# Patient Record
Sex: Male | Born: 1940 | Race: White | Hispanic: No | Marital: Married | Smoking: Former smoker
Health system: Southern US, Community
[De-identification: ages and names within clinical notes are randomized; demographics above are authoritative.]

## PROBLEM LIST (undated history)

## (undated) DIAGNOSIS — N400 Enlarged prostate without lower urinary tract symptoms: Secondary | ICD-10-CM

## (undated) DIAGNOSIS — I712 Thoracic aortic aneurysm, without rupture: Secondary | ICD-10-CM

## (undated) DIAGNOSIS — I2510 Atherosclerotic heart disease of native coronary artery without angina pectoris: Secondary | ICD-10-CM

## (undated) DIAGNOSIS — I493 Ventricular premature depolarization: Secondary | ICD-10-CM

## (undated) DIAGNOSIS — E785 Hyperlipidemia, unspecified: Secondary | ICD-10-CM

## (undated) DIAGNOSIS — I5032 Chronic diastolic (congestive) heart failure: Secondary | ICD-10-CM

## (undated) DIAGNOSIS — I7121 Aneurysm of the ascending aorta, without rupture: Secondary | ICD-10-CM

## (undated) HISTORY — DX: Chronic diastolic (congestive) heart failure: I50.32

## (undated) HISTORY — DX: Atherosclerotic heart disease of native coronary artery without angina pectoris: I25.10

## (undated) HISTORY — DX: Benign prostatic hyperplasia without lower urinary tract symptoms: N40.0

## (undated) HISTORY — DX: Hyperlipidemia, unspecified: E78.5

## (undated) HISTORY — DX: Aneurysm of the ascending aorta, without rupture: I71.21

## (undated) HISTORY — DX: Ventricular premature depolarization: I49.3

## (undated) HISTORY — DX: Thoracic aortic aneurysm, without rupture: I71.2

---

## 2002-05-30 ENCOUNTER — Encounter: Payer: Self-pay | Admitting: Urology

## 2002-05-30 ENCOUNTER — Encounter: Admission: RE | Admit: 2002-05-30 | Discharge: 2002-05-30 | Payer: Self-pay | Admitting: Urology

## 2010-01-30 ENCOUNTER — Telehealth (INDEPENDENT_AMBULATORY_CARE_PROVIDER_SITE_OTHER): Payer: Self-pay | Admitting: *Deleted

## 2010-01-30 ENCOUNTER — Ambulatory Visit: Payer: Self-pay | Admitting: Cardiovascular Disease

## 2010-02-07 ENCOUNTER — Telehealth (INDEPENDENT_AMBULATORY_CARE_PROVIDER_SITE_OTHER): Payer: Self-pay | Admitting: *Deleted

## 2010-02-11 ENCOUNTER — Ambulatory Visit: Payer: Self-pay | Admitting: Cardiology

## 2010-02-11 ENCOUNTER — Encounter: Payer: Self-pay | Admitting: Cardiology

## 2010-02-11 ENCOUNTER — Encounter (HOSPITAL_COMMUNITY): Admission: RE | Admit: 2010-02-11 | Discharge: 2010-03-07 | Payer: Self-pay | Admitting: Cardiovascular Disease

## 2010-02-11 ENCOUNTER — Ambulatory Visit: Payer: Self-pay

## 2010-02-11 ENCOUNTER — Ambulatory Visit (HOSPITAL_COMMUNITY): Admission: RE | Admit: 2010-02-11 | Discharge: 2010-02-11 | Payer: Self-pay | Admitting: Cardiovascular Disease

## 2010-02-14 HISTORY — PX: CORONARY ANGIOPLASTY: SHX604

## 2010-02-14 HISTORY — PX: CARDIAC CATHETERIZATION: SHX172

## 2010-02-20 ENCOUNTER — Telehealth: Payer: Self-pay | Admitting: Cardiovascular Disease

## 2010-02-22 ENCOUNTER — Telehealth (INDEPENDENT_AMBULATORY_CARE_PROVIDER_SITE_OTHER): Payer: Self-pay | Admitting: *Deleted

## 2010-02-22 ENCOUNTER — Ambulatory Visit: Payer: Self-pay | Admitting: Cardiovascular Disease

## 2010-02-27 ENCOUNTER — Inpatient Hospital Stay (HOSPITAL_COMMUNITY): Admission: RE | Admit: 2010-02-27 | Discharge: 2010-02-28 | Payer: Self-pay | Admitting: Cardiovascular Disease

## 2010-02-27 ENCOUNTER — Ambulatory Visit: Payer: Self-pay | Admitting: Cardiovascular Disease

## 2010-03-07 ENCOUNTER — Ambulatory Visit: Payer: Self-pay | Admitting: Cardiovascular Disease

## 2010-03-18 ENCOUNTER — Telehealth: Payer: Self-pay | Admitting: Cardiovascular Disease

## 2010-03-21 ENCOUNTER — Telehealth: Payer: Self-pay | Admitting: Cardiovascular Disease

## 2010-05-14 ENCOUNTER — Telehealth: Payer: Self-pay | Admitting: Cardiovascular Disease

## 2010-05-20 ENCOUNTER — Telehealth: Payer: Self-pay | Admitting: Cardiovascular Disease

## 2010-06-04 ENCOUNTER — Ambulatory Visit: Payer: Self-pay | Admitting: Cardiovascular Disease

## 2010-07-16 NOTE — Progress Notes (Signed)
  Phone Note Outgoing Call   Call placed by: Dessie Coma  LPN,  March 21, 2010 3:47 PM Call placed to: Patient Summary of Call: No Answer:  Tried to call patient and no answering machine.  Samples of Plavix available.  Follow-up for Phone Call        Phone call completed: Spoke with patient's wife-notified her of Plavix samples we have here for patient and per Dr. Kirke Corin, it is dangerous to stop the Plavix before 1 year.  Plavix will become generic in 04/12.  They are waiting to send records of income in to Plavix indigent program to see if eligible.  Follow-up by: Dessie Coma  LPN,  March 22, 2010 11:54 AM

## 2010-07-16 NOTE — Progress Notes (Signed)
Summary: Nuc Pre-Procedure  Phone Note Outgoing Call Call back at [facility] Phone [phone]   Call placed by: Antionette Char RN,  February 07, 2010 1:40 PM Call placed to: Patient Reason for Call: Confirm/change Appt Summary of Call: Reviewed information on [employer] Information Sheet (see scanned document for further details).  Spoke with patient's wife.      Nuclear Med Background Indications for Stress Test: Evaluation for Ischemia, Abnormal EKG  Indications Comments: Inferior Q waves    Symptoms: Chest Tightness, DOE, Fatigue, Palpitations    Nuclear Pre-Procedure Cardiac Risk Factors: Lipids, Smoker

## 2010-07-16 NOTE — Progress Notes (Signed)
Summary: liver/lipid results  Phone Note Outgoing Call   Call placed by: Dessie Coma  LPN,  May 14, 2010 9:09 AM Call placed to: Patient Summary of Call: LM with wife-lab test showed liver test is fine.  Cholesterol is better but still not at target.  Per Dr. Kirke Corin, to stop Simvastatin and start Atorvastatin 80mg  at bedtime .  To receck liver/lipid profile in 6 weeks.

## 2010-07-16 NOTE — Progress Notes (Signed)
----   Converted from flag ---- ---- 02/22/2010 3:13 PM, [name], CMA, AAMA wrote: PT HAS MEDICARE AND [employer] MCR SUP, NO PAC RQD  ---- 02/22/2010 3:10 PM, Dessie Coma  LPN wrote: Martin [name]. Hrt Cath scheduled with Dr. Kirke Corin for Wed. 02/27/10 for Dx abnormal stress test.  Thanks, Victorino Dike ------------------------------

## 2010-07-16 NOTE — Progress Notes (Signed)
----   Converted from flag ---- ---- 01/30/2010 3:26 PM, [name], CMA, AAMA wrote: PT HAS MCR AND [employer] MCR SUP, NO PAC RQD  ---- 01/30/2010 3:06 PM, Dessie Coma  LPN wrote: Eugenie Birks and Echo scheduled at [facility] for Dx: 786.50/786.90.  Thanks, Victorino Dike ------------------------------

## 2010-07-16 NOTE — Progress Notes (Signed)
Summary: CAN'T AFFORD MEDS  Phone Note Call from Patient   Summary of Call: PT CALLED AND STATED HE COULD NOT AFFORD PLAVIX.  $208 FOR 30 PILLS.  IS THERE ANYTHING ELSE HE CAN TAKE?  IF NOT, DOES HE QUALIFY FOR THE FREE DRUG PLAN WITH PLAVIX.  [phone] Initial call taken by: Park Breed,  March 18, 2010 9:47 AM    will give samples and application

## 2010-07-16 NOTE — Progress Notes (Signed)
  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 Kirke Corin, stress test abnormal and needs f/u ASAP.  Appt. scheduled for Fri. 02/22/10 at 2:30pm.  If patient not back in on truck, will reschedule.

## 2010-07-16 NOTE — Progress Notes (Signed)
Summary: resume Simvastatin  Phone Note Call from Patient   Caller: Spouse Summary of Call: T.C. from spouse-generic Lipitor is $150 without insurance.  Can not afford.  Per Dr. Kirke Corin, may resume Simvastatin 40mg  until generic Lipitor becomes cheaper. Initial call taken by: Dessie Coma  LPN,  May 20, 2010 10:44 AM

## 2010-07-16 NOTE — Assessment & Plan Note (Signed)
Summary: Cardiology Nuclear Testing  Nuclear Med Background Indications for Stress Test: Evaluation for Ischemia, Abnormal EKG  Indications Comments: .   History Comments: NO DOCUMENTED CAD  Symptoms: Chest Tightness, DOE, Fatigue, Rapid HR    Nuclear Pre-Procedure Cardiac Risk Factors: Lipids, Smoker Caffeine/Decaff Intake: None NPO After: 5:00 PM Lungs: Diminished bilaterally, but clear. IV 0.9% NS with Angio Cath: 20g     IV Site: R Antecubital IV Started by: Irean Hong, RN Chest Size (in) 42     Height (in): 65 Weight (lb): 181 BMI: 30.23  Nuclear Med Study 1 or 2 day study:  1 day     Stress Test Type:  Treadmill/Lexiscan Reading MD:  Willa Rough, MD     Referring MD:  Lorine Bears, MD Resting Radionuclide:  Technetium 56m Tetrofosmin     Resting Radionuclide Dose:  11 mCi  Stress Radionuclide:  Technetium 65m Tetrofosmin     Stress Radionuclide Dose:  33 mCi   Stress Protocol      Max HR:  93 bpm     Predicted Max HR:  150 bpm  Max Systolic BP: 163 mm Hg     Percent Max HR:  62 %Rate Pressure Product:  11914  Lexiscan: 0.4 mg   Stress Test Technologist:  [employer]a [employer], CMA-N     Nuclear Technologist:  Domenic Polite, CNMT  Rest Procedure  Myocardial perfusion imaging was performed at rest 45 minutes following the intravenous administration of Technetium 4m Tetrofosmin.  Stress Procedure  The patient received IV Lexiscan 0.4 mg over 15-seconds with concurrent low level exercise and then technetium 15m Tetrofosmin was injected at 30-seconds while the patient continued walking one more minute.  There were no significant changes with infusion.  Quantitative spect images were obtained after a 45 minute delay.  QPS Raw Data Images:  Patient motion noted; appropriate software correction applied. Stress Images:  There is significant decrease in activity localized to the inferior base only Rest Images:  Same as stress Subtraction (SDS):  No evidence of  ischemia. Transient Ischemic Dilatation:  0.98  (Normal <1.22)  Lung/Heart Ratio:  0.33  (Normal <0.45)  Quantitative Gated Spect Images QGS EDV:  99 ml QGS ESV:  49 ml QGS EF:  50 % QGS cine images:  Hypokinesis at the inferior base.  Findings Abnormal      Overall Impression  Exercise Capacity: Lexiscan with no exercise. BP Response: Normal blood pressure response. Clinical Symptoms: SOB ECG Impression: No significant ST segment change suggestive of ischemia. Overall Impression Comments: The data suggests scar localized to the base of the inferior wall. There is no ischemia.  Appended Document: Cardiology Nuclear Testing reviewed results with pt.  He will follow up at scheduled appt.

## 2010-08-29 LAB — BASIC METABOLIC PANEL
BUN: 7 mg/dL (ref 6–23)
BUN: 10 mg/dL (ref 6–23)
CO2: 25 mEq/L (ref 19–32)
CO2: 28 mEq/L (ref 19–32)
Calcium: 8.8 mg/dL (ref 8.4–10.5)
Calcium: 8.6 mg/dL (ref 8.4–10.5)
Chloride: 100 mEq/L (ref 96–112)
Chloride: 103 mEq/L (ref 96–112)
Creatinine, Ser: 0.91 mg/dL (ref 0.4–1.5)
Creatinine, Ser: 0.99 mg/dL (ref 0.4–1.5)
GFR calc Af Amer: >60 mL/min (ref >60)
GFR calc Af Amer: >60 mL/min (ref >60)
GFR calc non Af Amer: >60 mL/min (ref >60)
GFR calc non Af Amer: >60 mL/min (ref >60)
Glucose, Bld: 107 mg/dL — ABNORMAL HIGH (ref 70–99)
Glucose, Bld: 111 mg/dL — ABNORMAL HIGH (ref 70–99)
Potassium: 4.4 mEq/L (ref 3.5–5.1)
Potassium: 4.5 mEq/L (ref 3.5–5.1)
Sodium: 134 mEq/L — ABNORMAL LOW (ref 135–145)
Sodium: 134 mEq/L — ABNORMAL LOW (ref 135–145)

## 2010-08-29 LAB — CBC
HCT: 43.9 % (ref 39.0–52.0)
Hemoglobin: 15.2 g/dL (ref 13.0–17.0)
MCH: 30.4 pg (ref 26.0–34.0)
MCHC: 34.6 g/dL (ref 30.0–36.0)
MCHC: 34.8 g/dL (ref 30.0–36.0)
MCV: 87.8 fL (ref 78.0–100.0)
Platelets: 211 10*3/uL (ref 150–400)
Platelets: 196 10*3/uL (ref 150–400)
RBC: 5 MIL/uL (ref 4.22–5.81)
RDW: 13 % (ref 11.5–15.5)
RDW: 12.7 % (ref 11.5–15.5)
WBC: 7.5 10*3/uL (ref 4.0–10.5)
WBC: 6.2 10*3/uL (ref 4.0–10.5)

## 2010-08-29 LAB — CARDIAC PANEL(CRET KIN+CKTOT+MB+TROPI)
CK, MB: 1.1 ng/mL (ref 0.3–4.0)
Relative Index: INVALID (ref 0.0–2.5)
Total CK: 58 U/L (ref 7–232)
Total CK: 59 U/L (ref 7–232)

## 2010-08-29 LAB — APTT: aPTT: 27 seconds (ref 24–37)

## 2010-08-29 LAB — PROTIME-INR: INR: 0.98 (ref 0.00–1.49)

## 2010-09-03 ENCOUNTER — Telehealth: Payer: Self-pay | Admitting: Cardiovascular Disease

## 2010-09-03 NOTE — Telephone Encounter (Signed)
Returned patient's call-spoke to wife Steward Drone.  Patient has a tooth that needs pulling and is on Plavix and Baby Aspirin daily.  Wants to know how long he needs to hold medications prior to dental appt.  To call patient back on cell#[phone].

## 2010-09-04 ENCOUNTER — Other Ambulatory Visit: Payer: Self-pay | Admitting: Cardiovascular Disease

## 2010-09-06 ENCOUNTER — Telehealth: Payer: Self-pay | Admitting: Cardiovascular Disease

## 2010-09-06 NOTE — Telephone Encounter (Signed)
Pt's wife calling looking for RX to send to [place] for plavix--family cannot afford --advised i have located 4 boxes of plavix which i will give to jennifer toothman to carry back to [place] for pt--also advised wife, dr Kirke Corin needs to write RX for her to send to [place] and will be in [place] office on Thursday--wife agrees--nt

## 2010-09-06 NOTE — Telephone Encounter (Signed)
Pt wife calling upset regarding pt's meds. Pt paying $ 58.00 for 7 pills - dr. Kirke Corin told pt to order for [place]. Pt wife would like a called today. plavix 75 mg. Fax # [phone]. Customer # [phone].

## 2010-09-06 NOTE — Telephone Encounter (Signed)
Pharmacy called requesting clarification on Venlafaxine 150mg . Med is XR and can be dispensed on 1 1/2 tablets

## 2010-09-09 NOTE — Telephone Encounter (Signed)
T.C.from wife-anxious about getting Plavix from [place].  Informed wife I was working in [place] today but will be in [place] tomorrow-will pick up Plavix samples then to give her on Wed. When I return to [place] to work.  Also, Dr. Kirke Corin had signed an order for the medication and have tried to fax it several times.

## 2010-09-12 NOTE — Telephone Encounter (Signed)
Tried to call patient no answer. 

## 2010-09-26 ENCOUNTER — Other Ambulatory Visit: Payer: Self-pay | Admitting: *Deleted

## 2010-09-26 ENCOUNTER — Encounter: Payer: Self-pay | Admitting: Cardiovascular Disease

## 2010-09-26 MED ORDER — METOPROLOL TARTRATE 25 MG PO TABS: 25.0000 mg | ORAL_TABLET | Freq: Two times a day (BID) | ORAL | Status: DC

## 2010-10-03 ENCOUNTER — Ambulatory Visit (INDEPENDENT_AMBULATORY_CARE_PROVIDER_SITE_OTHER): Payer: Medicare Other | Admitting: Cardiovascular Disease

## 2010-10-03 ENCOUNTER — Encounter: Payer: Self-pay | Admitting: Cardiovascular Disease

## 2010-10-03 DIAGNOSIS — I77819 Aortic ectasia, unspecified site: Secondary | ICD-10-CM

## 2010-10-03 DIAGNOSIS — I2510 Atherosclerotic heart disease of native coronary artery without angina pectoris: Secondary | ICD-10-CM | POA: Insufficient documentation

## 2010-10-03 DIAGNOSIS — I77810 Thoracic aortic ectasia: Secondary | ICD-10-CM | POA: Insufficient documentation

## 2010-10-03 DIAGNOSIS — E785 Hyperlipidemia, unspecified: Secondary | ICD-10-CM | POA: Insufficient documentation

## 2010-10-03 NOTE — Patient Instructions (Signed)
Your physician recommends that you schedule a follow-up appointment in: 6 mths  

## 2010-10-03 NOTE — Progress Notes (Signed)
HPI  Martin [name] is here for a follow up visit. He is doing very well without any reported chest pain, dyspnea or palpitations. He was having difficult time affording Plavix. However, he is now getting it from [place] without problems.   No Known Allergies   Current Outpatient Prescriptions on File Prior to Visit  Medication Sig Dispense Refill  . aspirin 81 MG tablet Take 81 mg by mouth daily.        . clopidogrel (PLAVIX) 75 MG tablet Take 75 mg by mouth daily.        . metoprolol tartrate (LOPRESSOR) 25 MG tablet Take 1 tablet (25 mg total) by mouth 2 (two) times daily.  60 tablet  6  . simvastatin (ZOCOR) 40 MG tablet Take 40 mg by mouth at bedtime.        . Tamsulosin HCl (FLOMAX) 0.4 MG CAPS Take 0.4 mg by mouth daily.        Marland Kitchen atorvastatin (LIPITOR) 80 MG tablet Take 80 mg by mouth daily.    Not taking this       Past Medical History  Diagnosis Date  . BPH (benign prostatic hyperplasia)   . Dilated aortic root     Ascending aorta (noted during cath)  . CAD (coronary artery disease)   . HLD (hyperlipidemia)      Past Surgical History  Procedure Date  . Cardiac catheterization 02/2010    20% left main, 80% ostial/proximal LAD, 60% distal RCA leading into a small PDA which is occluded distally  . Coronary angioplasty 02/2010    LAD: 3.0 X 28 mm Promus DES     Family History  Problem Relation Age of Onset  . Cancer    . Heart disease    . Aneurysm      thoracic rupture     History   Social History  . Marital Status: Married    Spouse Name: N/A    Number of Children: 1  . Years of Education: N/A   Occupational History  . truck driver    Social History Main Topics  . Smoking status: Current Everyday Smoker -- 0.2 packs/day for 50 years  . Smokeless tobacco: Not on file  . Alcohol Use: Yes     beer on weekend  . Drug Use: No  . Sexually Active: Not on file   Other Topics Concern  . Not on file   Social History Narrative  . No narrative on file      ROS Constitutional: Negative for fever, chills, diaphoresis, activity change, appetite change and fatigue.  HENT: Negative for hearing loss, nosebleeds, congestion, sore throat, facial swelling, drooling, trouble swallowing, neck pain, voice change, sinus pressure and tinnitus.  Eyes: Negative for photophobia, pain, discharge and visual disturbance.  Respiratory: Negative for apnea, cough, chest tightness, shortness of breath and wheezing.  Cardiovascular: Negative for chest pain, palpitations and leg swelling.  Gastrointestinal: Negative for nausea, vomiting, abdominal pain, diarrhea, constipation, blood in stool and abdominal distention.  Genitourinary: Negative for dysuria, urgency, frequency, hematuria and decreased urine volume.  Musculoskeletal: Negative for myalgias, back pain, joint swelling, arthralgias and gait problem.  Skin: Negative for color change, pallor, rash and wound.  Neurological: Negative for dizziness, tremors, seizures, syncope, speech difficulty, weakness, light-headedness, numbness and headaches.  Psychiatric/Behavioral: Negative for suicidal ideas, hallucinations, behavioral problems and agitation. The patient is not nervous/anxious.     PHYSICAL EXAM   BP 139/79  Pulse 67  Ht 5\' 7"  (1.702 m)  Wt 176 lb (79.833 kg)  BMI 27.57 kg/m2  SpO2 94% Constitutional: He is oriented to person, place, and time. He appears well-developed and well-nourished. No distress.  HENT: No nasal discharge.  Head: Normocephalic and atraumatic.  Eyes: Pupils are equal, round, and reactive to light. Right eye exhibits no discharge. Left eye exhibits no discharge.  Neck: Normal range of motion. Neck supple. No JVD present. No thyromegaly present.  Cardiovascular: Normal rate, regular rhythm, normal heart sounds and intact distal pulses. Exam reveals no gallop and no friction rub.  No murmur heard.  Pulmonary/Chest: Effort normal and breath sounds normal. No stridor. No  respiratory distress. He has no wheezes. He has no rales. He exhibits no tenderness.  Abdominal: Soft. Bowel sounds are normal. He exhibits no distension. There is no tenderness. There is no rebound and no guarding.  Musculoskeletal: Normal range of motion. He exhibits no edema and no tenderness.  Neurological: He is alert and oriented to person, place, and time. Coordination normal.  Skin: Skin is warm and dry. No rash noted. He is not diaphoretic. No erythema. No pallor.  Psychiatric: He has a normal mood and affect. His behavior is normal. Judgment and thought content normal.        ASSESSMENT AND PLAN

## 2010-10-03 NOTE — Assessment & Plan Note (Signed)
He is doing very well without any symptoms suggestive of angina, heart failure or arrhythmia. Continue medical therapy. Continue dual antiplatelet therapy at least until 02/2011.

## 2010-10-03 NOTE — Assessment & Plan Note (Signed)
Asymptomatic. Noted during cardiac cath. Will plan on doing CTA later this year.

## 2010-10-03 NOTE — Assessment & Plan Note (Signed)
He was supposed to be taking Lipitor but it appears that he is still taking Simvastatin. Will check fasting lipid profile in 6 months. He has improved his diet. Goal LDL<70.

## 2010-10-29 NOTE — Assessment & Plan Note (Signed)
[facility]                        [place] [facility] OFFICE NOTE   NAME:[name], Martin NAUTA                    MRN:          [id number]  DATE:02/22/2010                            DOB:          [birth year]    PROBLEM LIST:  1. Tobacco use.  2. Hyperlipidemia.   INTERIM HISTORY:  I saw Martin [name] last month for evaluation of  significant exertional dyspnea with abnormal EKG suggestive of prior  inferior myocardial infarction.  He underwent workup with a nuclear  stress test as well as an echocardiogram.  Overall, he still feels the  same.  He gets out of breath with very minimal activities even walking  to the mailbox.  This has been progressive over the last few months.  He  does not have chest pain most of the time.  There is no orthopnea or  PND.  He is not aware of any prior history of myocardial infarction.   MEDICATIONS:  1. Vitamin A.  2. He was supposed to be on aspirin 81 mg once daily which will be      resumed today.  3. Simvastatin 40 mg at bedtime will be started today.  4. Metoprolol tartrate 25 mg twice daily will be started today.   ALLERGIES:  No known drug allergies.   PHYSICAL EXAMINATION:  GENERAL:  The patient is pleasant and appears to  be in no acute distress.  VITAL SIGNS:  Weight is 180.8 pounds, blood pressure is 132/92, pulse is  67, oxygen saturation is 94% on room air.  HEENT:  Normocephalic and atraumatic.  NECK:  No JVD or carotid bruits.  RESPIRATORY:  Normal respiratory effort with no use of accessory  muscles.  Auscultation reveals normal breath sounds.  CARDIOVASCULAR:  Normal PMI.  Normal S1 and S2 with no gallops or murmurs.  ABDOMEN:  Benign, nontender, nondistended.  EXTREMITIES:  With no clubbing, cyanosis or edema.  SKIN:  No rash.  PSYCHIATRIC:  He is alert, oriented x3.  He appears to be slightly  anxious.  MUSCULOSKELETAL:  There is normal muscle strength in the upper and lower  extremities.   IMPRESSION:  1. Exertional dyspnea with abnormal stress test.  The stress test      showed evidence of prior inferior infarct.  This indicates the      presence of underlying coronary artery disease.  Obviously he might      have significant disease in other territories as well.  He has      significant physical limitation with dyspnea with minimal      activities.  Thus I recommend proceeding with a cardiac      catheterization and possible coronary intervention.  The risks and      benefits were discussed with him.  He did have an echocardiogram      also which showed normal LV systolic function with inferior wall      hypokinesis, aortic sclerosis without stenosis and mild mitral      regurgitation.  He has history of prolonged smoking and possible      that he might have an element of  COPD as well but currently we need      to evaluate his cardiac status.  We will continue with aspirin 81      mg daily.  I will go ahead and add metoprolol tartrate 25 mg twice      daily.  2. Hyperlipidemia.  His LDL was 159.  We will go ahead and start      simvastatin 40 mg at bedtime.  3. Tobacco use.  He was counseled about importance of smoking      cessation.  4. Family history of thoracic aneurysm rupture that was in his      daughter.  Not sure of inheritance was from his wife's side.  The      patient did have a dilated ascending aorta on echocardiogram.  We      will evaluate that during cardiac catheterization or we might      obtain CT of the aorta if needed.     Lorine Bears, MD  Electronically Signed    MA/MedQ  DD: 02/22/2010  DT: 02/23/2010  Job #: [id number]

## 2010-10-29 NOTE — Letter (Signed)
January 30, 2010    Martin [name], M.D.  [address]  [place], [place]  [address]   RE:  Martin, [name]  MRN:  [id number]  /  DOB:  09-Nov-1940   Dear Dr. Sherral [name]:   Thank you for referring Martin [name] for cardiac evaluation.  As you are  aware, this is a 70 year old male who has the following problem list:  1. Tobacco use.  2. Hyperlipidemia.   The patient is here today for evaluation of dyspnea and chest  discomfort.  Overall, he was in his usual health up until a few months  ago when he started having dyspnea with minimal activities.  He gets out  of breath even walking less than 100 feet.  He occasionally gets chest  tightness and a pressure feeling with this.  He also feels palpitations.  There has been no presyncope or syncope.  There is no orthopnea or PND.  He denies any previous cardiac history.  There is no prior myocardial  infarction.  He does not have history of diabetes or hypertension.  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   PAST MEDICAL HISTORY:  As outlined above.   MEDICATIONS:  Vitamins once daily.   ALLERGIES:  NO KNOWN DRUG ALLERGIES.   SOCIAL HISTORY:  Remarkable for smoking about five cigarettes a day.  He  used to smoke one pack per day and has been smoking for 50 years.  He  drinks beer on the weekend.  There is no history of recreational drug  use.  The patient still works as a Naval architect.   FAMILY HISTORY:  There is no family history of premature coronary artery  disease.  His father died at the age of 5 from heart problems.  His  daughter died at the age of 74 from a ruptured thoracic aneurysm of  unclear etiology.   REVIEW OF SYSTEMS:  Remarkable for exertional dyspnea and occasional  chest tightness.  He also complains of generalized fatigue and  heartburn.  Full review of systems was performed and is otherwise  negative.   PHYSICAL EXAMINATION:  GENERAL:  The patient appears  to be anxious but  in no acute distress.  VITAL SIGNS:  Weight is 183.4 pounds, blood pressure is 156/91, pulse is  78, oxygen saturation is 96% on room air.  HEENT:  Normocephalic, atraumatic.  NECK:  No JVD or carotid bruits.  RESPIRATORY:  Normal respiratory effort with no use of accessory  muscles.  Auscultation reveals normal breath sounds with no crackles or  wheezing.  CARDIOVASCULAR:  Normal PMI.  Regular rate and rhythm with no gallops or  murmurs.  ABDOMEN:  Benign, nontender, nondistended.  EXTREMITIES:  With no clubbing, cyanosis or edema.  SKIN:  Warm and dry with no rash.  PSYCHIATRIC:  He is alert, oriented x3 with normal mood and affect.  MUSCULOSKELETAL:  Normal muscle strength in the upper and lower  extremities.   DIAGNOSTIC DATA:  The diagnostic data was reviewed.  We did an  electrocardiogram which showed normal sinus rhythm with evidence of  prior inferior infarct with no significant ST or T-wave changes.  His  blood work was reviewed which showed normal CBC and electrolytes as well  as renal function.  His lipid profile showed a total cholesterol of 227,  HDL of 44, triglyceride 118 and an LDL of 159.   IMPRESSION:  1. Significant exertional dyspnea and  occasional chest tightness:      This is worrisome for possible progressive angina.  His risk      factors include prolonged history of smoking, as well as the      hyperlipidemia.  I asked him to start taking aspirin 81 mg once      daily.  We will arrange for a Lexiscan nuclear stress test and an      echocardiogram.  The patient is unable to exercise on a treadmill      according to him due to significant physical limitations from his      difficulty breathing.  He also has an underlying abnormal EKG with      inferior Q-waves.  He has no clinical history of prior myocardial      infarction.  The patient is to avoid strenuous physical activities      until a cardiac workup is complete.  2. Hyperlipidemia:   His LDL was 159.  This will likely need medical      therapy but will address this after cardiac workup is finished.  3. Elevated blood pressure:  The patient does not have history of      hypertension and would monitor this on subsequent visits.  4. Tobacco use and history of ruptured thoracic aneurysm in his      daughter:  Will likely need to screen with an abdominal ultrasound      or a CT given that he is at risk.  The patient will follow up after      cardiac workup is done.   Thank you for allowing me to participate in the care of your patient.    Sincerely,      Martin Bears, MD  Electronically Signed    MA/MedQ  DD: 01/30/2010  DT: 01/30/2010  Job #: [id number]

## 2010-10-29 NOTE — Assessment & Plan Note (Signed)
HEALTHCARE                        [facility] OFFICE NOTE   NAME:Sidell, PRECIOUS GILCHREST                    MRN:          [id number]  DATE:06/11/2010                            DOB:          [birth year]    Mr. Allums is a 70 year old gentleman who is here today for a followup  visit.  He has the following problem list:  1. Coronary artery disease with significant two-vessel coronary artery      disease.  Cardiac catheterization in September of 2011, showed a      20% left main disease, 80% ostial LAD, and diffuse 70-80% proximal      left anterior descending coronary artery disease, distal right      coronary artery with 60% stenosis leading into a small PDA which      was diffusely diseased and occluded distally.  Ejection fraction      was 55%.  He underwent an angioplasty and drug-eluting stent      placement to ostial and proximal left anterior descending coronary      artery with a 3.0 x 28 mm Promus drug-eluting stent.  2. Previous tobacco use and quit after his angioplasty.  3. Hyperlipidemia.  4. Benign prostatic hypertrophy.  5. Mildly dilated descending aorta.   INTERVAL HISTORY:  Mr. Stelle has been doing very well overall.  He has  not had any chest pain, dyspnea, palpitations, presyncope, or syncope.  He is having hard time affording Plavix due to cost.  He has not smoked  since his angioplasty.  He improved his diet.  He could not attend  cardiac rehab.   MEDICATIONS:  1. Aspirin 81 mg once daily.  2. Plavix 75 mg once daily.  3. Flomax 0.4 mg once daily.  4. Metoprolol 25 mg twice daily.  5. Atorvastatin 80 mg at bedtime.   PHYSICAL EXAMINATION:  VITAL SIGNS:  Weight is 180 pounds, blood  pressure is 146/73, pulse is 60, oxygen saturation is 98% on room air.  NECK:  Reveals no JVD or carotid bruits.  LUNGS:  Clear to auscultation.  HEART:  Regular rate and rhythm with no gallops or murmurs.  ABDOMEN:  Benign, nontender,  nondistended.  EXTREMITIES:  With no clubbing, cyanosis, or edema.   IMPRESSION:  1. Coronary artery disease status post angioplasty and drug-eluting      stent placement to proximal left anterior descending coronary      artery.  He is currently not having any symptoms suggestive of      angina.  I stressed to him the importance of taking both aspirin      and Plavix regularly to prevent stent thrombosis.  He is having      hard time affording Plavix and thus I switched him to Effient 10 mg      once daily temporarily and they provided him with 2 months supplies      of samples.  I instructed him to go back on Plavix once his current      supply of Effient is finished.  Hopefully with Plavix, generic next      year, he  might be able to afford it better.  We will continue with      metoprolol 25 mg twice daily.  2. Hyperlipidemia:  His lipid profile while on simvastatin showed a      total cholesterol of 177, HDL of 49, triglyceride of 90, and an LDL      of 109.  Since then, I switched him to atorvastatin 80 mg at      bedtime.  We will request repeat fasting and liver profile.  His      goal LDL should be less than 70.  3. Mildly dilated ascending aorta, we will likely evaluate next year      with CTA.  He will follow up with me in 4 months from now or      earlier if needed.     Lorine Bears, MD  Electronically Signed    MA/MedQ  DD: 06/11/2010  DT: 06/12/2010  Job #: [id number]

## 2010-10-29 NOTE — Assessment & Plan Note (Signed)
[facility]                        [place] [facility] OFFICE NOTE   NAME:Martin [name], Martin [name]                    MRN:          [id number]  DATE:03/07/2010                            DOB:          1941-03-22    Martin [name] is a 70 year old gentleman with the following problem list:  1. Coronary artery disease with significant two-vessel coronary artery      disease detected after an abnormal stress test.  Cardiac      catheterization was performed on February 27, 2010.  It showed 20%      distal left main disease, 80% ostial LAD and diffuse 70 to 80%      disease in the proximal LAD, the distal RCA had a 60% lesion      leading into a small PDA which is diffusely diseased and 100%      occluded distally.  Ejection fraction was 55% with mild inferior      wall hypokinesis.  He underwent an angioplasty and a drug-eluting      stent placement to ostial and proximal LAD with 3.0 x 28 mm Promus      drug-eluting stent.  2. Tobacco use, quit recently.  3. Hyperlipidemia.  4. Benign prostatic hypertrophy.  5. Mildly dilated ascending aorta.   INTERIM HISTORY:  I saw Mr. Dugger recently after evaluation for  exertional dyspnea and an abnormal stress test.  He underwent cardiac  catheterization which showed significant two-vessel coronary artery  disease.  His right PDA was found to be occluded and was likely  responsible for his inferior scar on nuclear stress testing.  However,  there was also an 80% ostial and proximal LAD disease supplying large  territory.  He underwent an angioplasty and drug-eluting stent placement  without complications.  Since that time the patient feels much better.  His dyspnea has resolved completely.  He denies any chest pain.  He has  more energy.  There has been no palpitations, dizziness, syncope, or  presyncope.  He has been taking his medications without reported side  effects.   MEDICATIONS:  1. Aspirin 81 mg once  daily.  2. Plavix 75 mg once daily.  3. Simvastatin 40 mg once daily.  4. Metoprolol tartrate 25 mg twice daily.  5. Flomax 0.4 mg once daily.   PHYSICAL EXAMINATION:  VITAL SIGNS:  Weight is 182 pounds, blood  pressure is 138/79, pulse is 67, and oxygen saturation is 97% on room  air.  NECK:  No JVD or carotid bruits.  LUNGS:  Clear to auscultation.  HEART:  Regular rate and rhythm with no gallops or murmurs.  ABDOMEN:  Benign, nontender, nondistended.  EXTREMITIES:  No clubbing, cyanosis, or edema.  Right radial artery site  seems intact with normal pulse.  Right groin area shows a small  bruising, but no evidence of hematoma.   IMPRESSION:  1. Coronary artery disease, status post recent angioplasty and a drug-      eluting stent placement to ostial and proximal left anterior      descending artery.  The patient is doing much better since that  time.  His symptoms of dyspnea has improved significantly.  I had a      prolonged discussion with him about importance of lifestyle changes      including regular diet, smoking cessation, and exercise.  I      recommended that he enrolled in a cardiac rehab.  However, he is      not able to do that due to his job.  He can resume work on Monday,      September 26 without limitations.  I stressed to him the importance      of taking his medications regularly.  We will continue with aspirin      indefinitely as well as Plavix daily for at least 12 months and if      tolerated longer.  We will continue with metoprolol and      simvastatin.  2. Tobacco use:  The patient quit smoking since his angioplasty.  3. Hyperlipidemia:  He is now on simvastatin 40 mg.  We will check his      lipid profile in one month.  Will target an LDL level of less than      70.  He will follow up with me in 3 months from now or earlier if      needed.  4. Mildly dilated ascending aorta: will evaluate in few months with a      CTA.     Lorine Bears, MD   Electronically Signed    MA/MedQ  DD: 03/07/2010  DT: 03/08/2010  Job #: [id number]

## 2011-01-01 ENCOUNTER — Encounter: Payer: Self-pay | Admitting: Cardiovascular Disease

## 2011-03-17 ENCOUNTER — Other Ambulatory Visit: Payer: Self-pay | Admitting: Cardiovascular Disease

## 2011-03-19 MED ORDER — SIMVASTATIN 40 MG PO TABS: 40.0000 mg | ORAL_TABLET | Freq: Every day | ORAL | Status: DC

## 2011-04-07 ENCOUNTER — Ambulatory Visit (INDEPENDENT_AMBULATORY_CARE_PROVIDER_SITE_OTHER): Payer: Medicare Other | Admitting: Cardiovascular Disease

## 2011-04-07 ENCOUNTER — Encounter: Payer: Self-pay | Admitting: Cardiovascular Disease

## 2011-04-07 VITALS — BP 126/75 | HR 62 | Resp 18 | Ht 67.0 in | Wt 174.0 lb

## 2011-04-07 DIAGNOSIS — N529 Male erectile dysfunction, unspecified: Secondary | ICD-10-CM | POA: Insufficient documentation

## 2011-04-07 DIAGNOSIS — I712 Thoracic aortic aneurysm, without rupture: Secondary | ICD-10-CM

## 2011-04-07 DIAGNOSIS — I2510 Atherosclerotic heart disease of native coronary artery without angina pectoris: Secondary | ICD-10-CM

## 2011-04-07 DIAGNOSIS — E785 Hyperlipidemia, unspecified: Secondary | ICD-10-CM

## 2011-04-07 LAB — BASIC METABOLIC PANEL
BUN: 14 mg/dL (ref 6–23)
Chloride: 105 mEq/L (ref 96–112)
Creatinine, Ser: 1.1 mg/dL (ref 0.4–1.5)
GFR: 67.92 mL/min (ref >60.00)
Potassium: 3.9 mEq/L (ref 3.5–5.1)

## 2011-04-07 MED ORDER — SILDENAFIL CITRATE 100 MG PO TABS: ORAL_TABLET | ORAL | Status: DC

## 2011-04-07 MED ORDER — CLOPIDOGREL BISULFATE 75 MG PO TABS: 75.0000 mg | ORAL_TABLET | Freq: Every day | ORAL | Status: DC

## 2011-04-07 NOTE — Patient Instructions (Signed)
Your physician wants you to follow-up in: 6 months. You will receive a reminder letter in the mail two months in advance. If you don't receive a letter, please call our office to schedule the follow-up appointment.  Non-Cardiac CT Angiography (CTA), is a special type of CT scan that uses a computer to produce multi-dimensional views of major blood vessels throughout the body. In CT angiography, a contrast material is injected through an IV to help visualize the blood vessels  Your physician recommends that you return for fasting lab work on day of CT Scan--Lipid and liver profile--272

## 2011-04-07 NOTE — Assessment & Plan Note (Signed)
He is on Crestor 20 mg po QHS. Will check lipids and LFTs.

## 2011-04-07 NOTE — Assessment & Plan Note (Signed)
Chest CTA to evaluate ascending aorta.

## 2011-04-07 NOTE — Assessment & Plan Note (Addendum)
Stable. Continue ASA, Plavix, statin, beta blocker.

## 2011-04-07 NOTE — Assessment & Plan Note (Signed)
Will start Viagra.

## 2011-04-07 NOTE — Progress Notes (Signed)
History of Present Illness:Martin [name] is a 70 yo WM with h/o CAD, HLD, dilated aortic root, BPH, former tobacco abuse here today for cardiac follow up. He has been followed in the past by Dr. Kirke Corin. Stress test September 2011 with ischemia. Cath September 2011 with showed a 20% left main disease, 80% ostial LAD, and diffuse 70-80% proximal left anterior descending coronary artery disease, distal right coronary artery with 60% stenosis leading into a small PDA which was diffusely diseased and occluded distally.  Ejection fraction      was 55%.  He underwent an angioplasty and drug-eluting stent placement to ostial and proximal left anterior descending coronary artery with a 3.0 x 28 mm Promus drug-eluting stent.   He has done well. No chest pain, SOB, palpitations. He has not smoked in over a year. He does complain of erectile dysfunction.     Past Medical History  Diagnosis Date  . BPH (benign prostatic hyperplasia)   . Dilated aortic root     Ascending aorta (noted during cath)  . CAD (coronary artery disease)   . HLD (hyperlipidemia)     Past Surgical History  Procedure Date  . Cardiac catheterization 02/2010    20% left main, 80% ostial/proximal LAD, 60% distal RCA leading into a small PDA which is occluded distally  . Coronary angioplasty 02/2010    LAD: 3.0 X 28 mm Promus DES    Current Outpatient Prescriptions  Medication Sig Dispense Refill  . aspirin 81 MG tablet Take 81 mg by mouth daily.        Marland Kitchen atorvastatin (LIPITOR) 80 MG tablet Take 80 mg by mouth daily.        . clopidogrel (PLAVIX) 75 MG tablet Take 75 mg by mouth daily.        . metoprolol tartrate (LOPRESSOR) 25 MG tablet Take 1 tablet (25 mg total) by mouth 2 (two) times daily.  60 tablet  6  . simvastatin (ZOCOR) 40 MG tablet Take 1 tablet (40 mg total) by mouth at bedtime.  30 tablet  4  . Tamsulosin HCl (FLOMAX) 0.4 MG CAPS Take 0.4 mg by mouth daily.          No Known Allergies  History   Social History  .  Marital Status: Married    Spouse Name: N/A    Number of Children: 1  . Years of Education: N/A   Occupational History  . truck driver    Social History Main Topics  . Smoking status: Former Smoker -- 0.2 packs/day for 50 years    Types: Cigarettes    Quit date: 03/04/2010  . Smokeless tobacco: Not on file  . Alcohol Use: Yes     beer on weekend  . Drug Use: No  . Sexually Active: Not on file   Other Topics Concern  . Not on file   Social History Narrative  . No narrative on file    Family History  Problem Relation Age of Onset  . Cancer    . Heart disease    . Aneurysm      thoracic rupture    Review of Systems:  As stated in the HPI and otherwise negative.   BP 126/75  Pulse 62  Resp 18  Ht 5\' 7"  (1.702 m)  Wt 174 lb (78.926 kg)  BMI 27.25 kg/m2  Physical Examination: General: Well developed, well nourished, NAD HEENT: OP clear, mucus membranes moist SKIN: warm, dry. No rashes. Neuro: No focal deficits Musculoskeletal: Muscle  strength 5/5 all ext Psychiatric: Mood and affect normal Neck: No JVD, no carotid bruits, no thyromegaly, no lymphadenopathy. Lungs:Clear bilaterally, no wheezes, rhonci, crackles Cardiovascular: Regular rate and rhythm. No murmurs, gallops or rubs. Abdomen:Soft. Bowel sounds present. Non-tender.  Extremities: No lower extremity edema. Pulses are 2 + in the bilateral DP/PT.  EKG: NSR, rate 62 bpm.

## 2011-04-09 ENCOUNTER — Telehealth: Payer: Self-pay | Admitting: *Deleted

## 2011-04-09 DIAGNOSIS — E785 Hyperlipidemia, unspecified: Secondary | ICD-10-CM

## 2011-04-09 MED ORDER — SIMVASTATIN 40 MG PO TABS: 40.0000 mg | ORAL_TABLET | Freq: Every evening | ORAL | Status: DC

## 2011-04-09 NOTE — Telephone Encounter (Signed)
Called pt to give him results of BMP. Spoke with wife who reports pt is taking Simvastatin 40 mg daily not Crestor as they reported at recent office visit.  Will update med list. Wife given results of BMP.

## 2011-04-28 ENCOUNTER — Other Ambulatory Visit (INDEPENDENT_AMBULATORY_CARE_PROVIDER_SITE_OTHER): Payer: Medicare Other | Admitting: *Deleted

## 2011-04-28 ENCOUNTER — Other Ambulatory Visit: Payer: Self-pay | Admitting: Cardiovascular Disease

## 2011-04-28 ENCOUNTER — Ambulatory Visit (INDEPENDENT_AMBULATORY_CARE_PROVIDER_SITE_OTHER)
Admission: RE | Admit: 2011-04-28 | Discharge: 2011-04-28 | Disposition: A | Discharge status: Home / Self Care | Payer: Medicare Other | Source: Ambulatory Visit | Attending: Cardiovascular Disease | Admitting: Cardiovascular Disease

## 2011-04-28 DIAGNOSIS — I712 Thoracic aortic aneurysm, without rupture, unspecified: Secondary | ICD-10-CM

## 2011-04-28 DIAGNOSIS — E785 Hyperlipidemia, unspecified: Secondary | ICD-10-CM

## 2011-04-28 MED ORDER — IOHEXOL 300 MG/ML  SOLN
80.0000 mL | Freq: Once | INTRAMUSCULAR | Status: AC | PRN
  Administered 2011-04-28: 80 mL via INTRAVENOUS

## 2011-04-29 ENCOUNTER — Telehealth: Payer: Self-pay | Admitting: *Deleted

## 2011-04-29 DIAGNOSIS — E785 Hyperlipidemia, unspecified: Secondary | ICD-10-CM

## 2011-04-29 LAB — HEPATIC FUNCTION PANEL
AST: 23 U/L (ref 0–37)
Albumin: 3.6 g/dL (ref 3.5–5.2)
Alkaline Phosphatase: 59 U/L (ref 39–117)
Total Protein: 6.6 g/dL (ref 6.0–8.3)

## 2011-04-29 LAB — LIPID PANEL
Cholesterol: 209 mg/dL — ABNORMAL HIGH (ref 0–200)
Total CHOL/HDL Ratio: 5
Triglycerides: 100 mg/dL (ref 0.0–149.0)
VLDL: 20 mg/dL (ref 0.0–40.0)

## 2011-04-29 LAB — LDL CHOLESTEROL, DIRECT: Direct LDL: 171.7 mg/dL

## 2011-04-29 MED ORDER — ROSUVASTATIN CALCIUM 40 MG PO TABS: 40.0000 mg | ORAL_TABLET | Freq: Every day | ORAL | Status: DC

## 2011-04-29 NOTE — Telephone Encounter (Signed)
Notes Recorded by Dossie Arbour, RN on 04/29/2011 at 5:14 PM Pt called after last office visit and told us he is taking Simvastatin 40 daily, not Crestor. Reviewed with Dr. Clifton James and he would like pt to stop Simvastatin and start Crestor 40 mg daily with lipid and liver profiles in 3 months. Notes Recorded by Verne Carrow, MD on 04/29/2011 at 3:15 PM Lipids are not at goal. Can we increase his crestor to 40 mg po QHS? Thanks, chris  Above information copied from lab results.  Spoke with pt and gave him above information. Will send Crestor 40 mg daily to [facility] in [place] per his request. He will come to the office the second week of February for fasting lipid and liver profile.

## 2011-04-29 NOTE — Telephone Encounter (Signed)
Called [facility] in [place] and cancelled prescription and sent prescription to [facility] in [place]

## 2011-07-30 ENCOUNTER — Other Ambulatory Visit: Payer: Medicare Other | Admitting: *Deleted

## 2011-08-25 ENCOUNTER — Other Ambulatory Visit (INDEPENDENT_AMBULATORY_CARE_PROVIDER_SITE_OTHER): Payer: Medicare Other

## 2011-08-25 DIAGNOSIS — E785 Hyperlipidemia, unspecified: Secondary | ICD-10-CM

## 2011-08-25 LAB — LIPID PANEL
Cholesterol: 211 mg/dL — ABNORMAL HIGH (ref 0–200)
HDL: 56.8 mg/dL (ref >39.00)
Triglycerides: 114 mg/dL (ref 0.0–149.0)
VLDL: 22.8 mg/dL (ref 0.0–40.0)

## 2011-08-25 LAB — HEPATIC FUNCTION PANEL
Albumin: 3.9 g/dL (ref 3.5–5.2)
Total Protein: 6.4 g/dL (ref 6.0–8.3)

## 2011-09-09 ENCOUNTER — Telehealth: Payer: Self-pay | Admitting: Cardiovascular Disease

## 2011-09-09 DIAGNOSIS — E785 Hyperlipidemia, unspecified: Secondary | ICD-10-CM

## 2011-09-09 MED ORDER — EZETIMIBE 10 MG PO TABS: 10.0000 mg | ORAL_TABLET | Freq: Every day | ORAL | Status: DC

## 2011-09-09 NOTE — Telephone Encounter (Signed)
Pt's wife rtn call to pat re  blood work

## 2011-09-09 NOTE — Telephone Encounter (Signed)
Spoke with wife and reviewed lab results and told her Dr. Clifton James would like pt to start Zetia 10 mg by mouth daily in addition to Crestor. He will make change and come back for lipid profile the last week in June.  Will send prescription to [facility] in [place]

## 2011-09-22 ENCOUNTER — Telehealth: Payer: Self-pay | Admitting: Cardiovascular Disease

## 2011-09-22 DIAGNOSIS — E785 Hyperlipidemia, unspecified: Secondary | ICD-10-CM

## 2011-09-22 NOTE — Telephone Encounter (Signed)
Patient's wife called stating crestor is too expensive, can Dr.McAlhany prescribe alternate med.Fowarded to [facility] for advice.

## 2011-09-22 NOTE — Telephone Encounter (Signed)
Please return call to patient wife Steward Drone [phone]   Patient was given Crestor Prescription he cannot afford.  Patient wife would like a return call to discuss possible alternate med. She can be reached at [phone].

## 2011-09-23 NOTE — Telephone Encounter (Signed)
We change him to Atorvastatin 40 mg po QHS. Will need lipids and LFTs in 12 weeks.  Thanks, chris

## 2011-09-24 MED ORDER — ATORVASTATIN CALCIUM 40 MG PO TABS: 40.0000 mg | ORAL_TABLET | Freq: Every day | ORAL | Status: DC

## 2011-09-24 NOTE — Telephone Encounter (Signed)
Spoke with wife. She reports lab work that she was mistaken when she told me that labs done in March were done while pt was on Crestor. She states she is on a cholesterol medicine and sometimes gets the names confused with the meds her husband is taking.  She reports that pt was  still taking simvastatin 40 mg when March labs done. When they went to pick up Crestor it was too expensive and they are requesting cheaper alternative. Also can not afford Zetia.  I gave wife instructions from Dr. Clifton James to change to Atorvastatin 40 mg daily. Wife aware pt will need to come in for fasting lipid and liver profiles the first week of July. Will send prescription to [facility] in [place]

## 2011-11-18 ENCOUNTER — Other Ambulatory Visit: Payer: Self-pay | Admitting: Cardiovascular Disease

## 2011-12-10 ENCOUNTER — Other Ambulatory Visit: Payer: Medicare Other

## 2011-12-16 ENCOUNTER — Other Ambulatory Visit (INDEPENDENT_AMBULATORY_CARE_PROVIDER_SITE_OTHER): Payer: Medicare Other

## 2011-12-16 DIAGNOSIS — E785 Hyperlipidemia, unspecified: Secondary | ICD-10-CM

## 2011-12-16 LAB — HEPATIC FUNCTION PANEL
ALT: 8 U/L (ref 0–53)
AST: 16 U/L (ref 0–37)
Albumin: 3.8 g/dL (ref 3.5–5.2)
Total Protein: 6.4 g/dL (ref 6.0–8.3)

## 2011-12-16 LAB — LIPID PANEL
Total CHOL/HDL Ratio: 4
Triglycerides: 91 mg/dL (ref 0.0–149.0)

## 2011-12-16 LAB — LDL CHOLESTEROL, DIRECT: Direct LDL: 131.1 mg/dL

## 2011-12-30 ENCOUNTER — Other Ambulatory Visit: Payer: Self-pay | Admitting: *Deleted

## 2011-12-30 DIAGNOSIS — E785 Hyperlipidemia, unspecified: Secondary | ICD-10-CM

## 2011-12-30 MED ORDER — ATORVASTATIN CALCIUM 80 MG PO TABS: 80.0000 mg | ORAL_TABLET | Freq: Every day | ORAL | Status: DC

## 2012-02-02 ENCOUNTER — Ambulatory Visit: Payer: Medicare Other | Admitting: Cardiovascular Disease

## 2012-02-27 ENCOUNTER — Ambulatory Visit (INDEPENDENT_AMBULATORY_CARE_PROVIDER_SITE_OTHER): Payer: Medicare Other | Admitting: Cardiovascular Disease

## 2012-02-27 ENCOUNTER — Encounter: Payer: Self-pay | Admitting: Cardiovascular Disease

## 2012-02-27 VITALS — BP 128/78 | HR 67 | Ht 67.0 in | Wt 170.0 lb

## 2012-02-27 DIAGNOSIS — I4949 Other premature depolarization: Secondary | ICD-10-CM

## 2012-02-27 DIAGNOSIS — I25810 Atherosclerosis of coronary artery bypass graft(s) without angina pectoris: Secondary | ICD-10-CM

## 2012-02-27 DIAGNOSIS — I493 Ventricular premature depolarization: Secondary | ICD-10-CM

## 2012-02-27 DIAGNOSIS — E785 Hyperlipidemia, unspecified: Secondary | ICD-10-CM

## 2012-02-27 NOTE — Progress Notes (Signed)
History of Present Illness: [name] is a 71 yo WM with h/o CAD, HLD, dilated aortic root, BPH, former tobacco abuse here today for cardiac follow up. He has been followed in the past by Dr. Kirke Corin. I saw last in October 2012. Stress test September 2011 with ischemia. Cath September 2011 with showed a 20% left main disease, 80% ostial LAD, and diffuse 70-80% proximal left anterior descending coronary artery disease, distal right coronary artery with 60% stenosis leading into a small PDA which was diffusely diseased and occluded distally. Ejection fraction was 55%. He underwent an angioplasty and drug-eluting stent placement to ostial and proximal left anterior descending coronary artery with a 3.0 x 28 mm Promus drug-eluting stent.   He is here today for follow up. He has done well. No chest pain, SOB, palpitations. He has stopped smoking.  He does not exercise. 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   Primary Care Physician: Keturah Barre  Last Lipid Profile:Lipid Panel     Component Value Date/Time   CHOL 202* 12/16/2011 0834   TRIG 91.0 12/16/2011 0834   HDL 48.00 12/16/2011 0834   CHOLHDL 4 12/16/2011 0834   VLDL 18.2 12/16/2011 0834  LDL 131   Past Medical History  Diagnosis Date  . BPH (benign prostatic hyperplasia)   . Dilated aortic root     Ascending aorta (noted during cath)  . CAD (coronary artery disease)   . HLD (hyperlipidemia)     Past Surgical History  Procedure Date  . Cardiac catheterization 02/2010    20% left main, 80% ostial/proximal LAD, 60% distal RCA leading into a small PDA which is occluded distally  . Coronary angioplasty 02/2010    LAD: 3.0 X 28 mm Promus DES    Current Outpatient Prescriptions  Medication Sig Dispense Refill  . aspirin 81 MG tablet Take 81 mg by mouth daily.        Marland Kitchen atorvastatin (LIPITOR) 80 MG tablet Take 1 tablet (80 mg total) by mouth daily.  30 tablet  6  . clopidogrel (PLAVIX) 75 MG tablet Take 1 tablet (75  mg total) by mouth daily.  30 tablet  11  . metoprolol tartrate (LOPRESSOR) 25 MG tablet TAKE 1 TABLET BY MOUTH TWO TIMES DAILY.  60 tablet  5  . sildenafil (VIAGRA) 100 MG tablet Take half tablet by mouth as needed as directed  4 tablet  4  . Tamsulosin HCl (FLOMAX) 0.4 MG CAPS Take 0.4 mg by mouth daily.          No Known Allergies  History   Social History  . Marital Status: Married    Spouse Name: N/A    Number of Children: 1  . Years of Education: N/A   Occupational History  . truck driver    Social History Main Topics  . Smoking status: Former Smoker -- 0.2 packs/day for 50 years    Types: Cigarettes    Quit date: 03/04/2010  . Smokeless tobacco: Not on file  . Alcohol Use: Yes     beer on weekend  . Drug Use: No  . Sexually Active: Not on file   Other Topics Concern  . Not on file   Social History Narrative  . No narrative on file    Family History  Problem Relation Age of Onset  . Cancer    . Heart disease    . Aneurysm      thoracic rupture  Review of Systems:  As stated in the HPI and otherwise negative.   BP 128/78  Pulse 67  Ht 5\' 7"  (1.702 m)  Wt 170 lb (77.111 kg)  BMI 26.63 kg/m2  Physical Examination: General: Well developed, well nourished, NAD HEENT: OP clear, mucus membranes moist SKIN: warm, dry. No rashes. Neuro: No focal deficits Musculoskeletal: Muscle strength 5/5 all ext Psychiatric: Mood and affect normal Neck: No JVD, no carotid bruits, no thyromegaly, no lymphadenopathy. Lungs:Clear bilaterally, no wheezes, rhonci, crackles Cardiovascular: Regular rate and rhythm with ectopy.  No murmurs, gallops or rubs. Abdomen:Soft. Bowel sounds present. Non-tender.  Extremities: No lower extremity edema. Pulses are 2 + in the bilateral DP/PT.  JWJ:XBJYN rhythm, rate 67 bpm. PVCs.   Assessment and Plan:   1. CAD: Stable. Will continue ASA/Plavix/beta blocker/statin.   2. Hyperlipidemia: Lipitor increased in July 2013 to 80 mg po  QHS because his LDL was not at goal. Repeat lipids and LFTs in 1 month.   3. PVCs: Noted on EKG today. Asymptomatic. Continue beta blocker.

## 2012-02-27 NOTE — Patient Instructions (Addendum)
Your physician wants you to follow-up in:  12 months.You will receive a reminder letter in the mail two months in advance. If you don't receive a letter, please call our office to schedule the follow-up appointment.  You have lab work scheduled for March 22, 2012

## 2012-03-22 ENCOUNTER — Other Ambulatory Visit: Payer: Medicare Other

## 2012-03-29 ENCOUNTER — Encounter: Payer: Self-pay | Admitting: *Deleted

## 2012-04-13 ENCOUNTER — Other Ambulatory Visit: Payer: Self-pay | Admitting: Cardiovascular Disease

## 2012-11-25 ENCOUNTER — Telehealth: Payer: Self-pay | Admitting: Cardiovascular Disease

## 2012-11-25 NOTE — Telephone Encounter (Signed)
New Prob     Pt would like to speak to nurse regarding his current medication regimen. Please call,.

## 2012-11-25 NOTE — Telephone Encounter (Signed)
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

## 2012-11-25 NOTE — Telephone Encounter (Signed)
Left message to call back  

## 2012-11-30 NOTE — Telephone Encounter (Signed)
Left message to call back  

## 2012-12-30 NOTE — Telephone Encounter (Signed)
Spoke with pt who reports he does not have any questions at this time.

## 2013-01-31 ENCOUNTER — Other Ambulatory Visit: Payer: Self-pay | Admitting: Cardiovascular Disease

## 2013-02-05 ENCOUNTER — Other Ambulatory Visit: Payer: Self-pay | Admitting: Cardiovascular Disease

## 2013-02-16 ENCOUNTER — Encounter: Payer: Self-pay | Admitting: Cardiovascular Disease

## 2013-02-16 ENCOUNTER — Ambulatory Visit (INDEPENDENT_AMBULATORY_CARE_PROVIDER_SITE_OTHER): Payer: Medicare Other | Admitting: Cardiovascular Disease

## 2013-02-16 ENCOUNTER — Encounter: Payer: Self-pay | Admitting: *Deleted

## 2013-02-16 VITALS — BP 140/92 | HR 61 | Ht 67.0 in | Wt 174.0 lb

## 2013-02-16 DIAGNOSIS — I200 Unstable angina: Secondary | ICD-10-CM

## 2013-02-16 DIAGNOSIS — I2510 Atherosclerotic heart disease of native coronary artery without angina pectoris: Secondary | ICD-10-CM

## 2013-02-16 NOTE — Progress Notes (Signed)
History of Present Illness: Mr. Howerter is a 72 yo WM with h/o CAD, HLD, dilated aortic root, BPH, former tobacco abuse here today for cardiac follow up. He has been followed in the past by Dr. Kirke Corin. Stress test September 2011 with ischemia. Cath September 2011 with showed a 20% left main disease, 80% ostial LAD, and diffuse 70-80% proximal left anterior descending coronary artery disease, distal right coronary artery with 60% stenosis leading into a small PDA which was diffusely diseased and occluded distally. Ejection fraction was 55%. He underwent an angioplasty and drug-eluting stent placement to ostial and proximal left anterior descending coronary artery with a 3.0 x 28 mm Promus drug-eluting stent.   He is here today for follow up. He has been having exertional dyspnea similar to prior angina. No chest pain, palpitations. He has stopped smoking. Overall energy level is poor. 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   Primary Care Physician: Keturah Barre  Last Lipid Profile:Lipid Panel     Component Value Date/Time   CHOL 202* 12/16/2011 0834   TRIG 91.0 12/16/2011 0834   HDL 48.00 12/16/2011 0834   CHOLHDL 4 12/16/2011 0834   VLDL 18.2 12/16/2011 0834    Past Medical History  Diagnosis Date  . BPH (benign prostatic hyperplasia)   . Dilated aortic root     Ascending aorta (noted during cath)  . CAD (coronary artery disease)   . HLD (hyperlipidemia)     Past Surgical History  Procedure Laterality Date  . Cardiac catheterization  02/2010    20% left main, 80% ostial/proximal LAD, 60% distal RCA leading into a small PDA which is occluded distally  . Coronary angioplasty  02/2010    LAD: 3.0 X 28 mm Promus DES    Current Outpatient Prescriptions  Medication Sig Dispense Refill  . aspirin 81 MG tablet Take 81 mg by mouth daily.        . clopidogrel (PLAVIX) 75 MG tablet TAKE 1 TABLET (75 MG TOTAL) BY MOUTH DAILY.  30 tablet  5  . metoprolol tartrate (LOPRESSOR)  25 MG tablet       . Tamsulosin HCl (FLOMAX) 0.4 MG CAPS Take 0.4 mg by mouth daily.        Marland Kitchen atorvastatin (LIPITOR) 80 MG tablet Take 1 tablet (80 mg total) by mouth daily.  30 tablet  6   No current facility-administered medications for this visit.    No Known Allergies  History   Social History  . Marital Status: Married    Spouse Name: N/A    Number of Children: 1  . Years of Education: N/A   Occupational History  . truck driver    Social History Main Topics  . Smoking status: Former Smoker -- 0.20 packs/day for 50 years    Types: Cigarettes    Quit date: 03/04/2010  . Smokeless tobacco: Not on file  . Alcohol Use: Yes     Comment: beer on weekend  . Drug Use: No  . Sexual Activity: Not on file   Other Topics Concern  . Not on file   Social History Narrative  . No narrative on file    Family History  Problem Relation Age of Onset  . Cancer    . Heart disease    . Aneurysm      thoracic rupture    Review of Systems:  As stated in the HPI and otherwise negative.   BP 140/92  Pulse  61  Ht 5\' 7"  (1.702 m)  Wt 174 lb (78.926 kg)  BMI 27.25 kg/m2  Physical Examination: General: Well developed, well nourished, NAD HEENT: OP clear, mucus membranes moist SKIN: warm, dry. No rashes. Neuro: No focal deficits Musculoskeletal: Muscle strength 5/5 all ext Psychiatric: Mood and affect normal Neck: No JVD, no carotid bruits, no thyromegaly, no lymphadenopathy. Lungs:Clear bilaterally, no wheezes, rhonci, crackles Cardiovascular: Regular rate and rhythm. No murmurs, gallops or rubs. Abdomen:Soft. Bowel sounds present. Non-tender.  Extremities: No lower extremity edema. Pulses are 2 + in the bilateral DP/PT.  EKG: Sinus, rate 61 bpm. PVC. Inferior Q wave.   Assessment and Plan:   1. CAD: Recent exertional dyspnea and fatigue with minimal exertion similar to prior angina. This is worrisome for unstable angina. Will arrange left heart cath on 02/18/13 in the main  cath lab at [facility]. Risks and benefits reviewed. Right groin approach as he has tortuous subclavian arteries and failed right radial approach 2011. Will continue ASA/Plavix/beta blocker/statin.   2. Hyperlipidemia: On a statin  3. PVCs: Noted on EKG today. Asymptomatic. Continue beta blocker.

## 2013-02-16 NOTE — Patient Instructions (Addendum)
Your physician recommends that you schedule a follow-up appointment in: about 8 weeks. --April 15, 2013 at 9:30  Your physician has requested that you have a cardiac catheterization. Cardiac catheterization is used to diagnose and/or treat various heart conditions. Doctors may recommend this procedure for a number of different reasons. The most common reason is to evaluate chest pain. Chest pain can be a symptom of coronary artery disease (CAD), and cardiac catheterization can show whether plaque is narrowing or blocking your heart's arteries. This procedure is also used to evaluate the valves, as well as measure the blood flow and oxygen levels in different parts of your heart. For further information please visit [email or web address] Please follow instruction sheet, as given. Scheduled for February 18, 2013

## 2013-02-17 ENCOUNTER — Encounter (HOSPITAL_COMMUNITY): Payer: Self-pay | Admitting: Pharmacy Technician

## 2013-02-17 ENCOUNTER — Telehealth: Payer: Self-pay | Admitting: *Deleted

## 2013-02-17 LAB — BASIC METABOLIC PANEL
BUN: 18 mg/dL (ref 6–23)
CO2: 27 mEq/L (ref 19–32)
Chloride: 105 mEq/L (ref 96–112)
Creatinine, Ser: 1 mg/dL (ref 0.4–1.5)
Potassium: 4.9 mEq/L (ref 3.5–5.1)

## 2013-02-17 LAB — CBC WITH DIFFERENTIAL/PLATELET
Basophils Relative: 0.4 % (ref 0.0–3.0)
Eosinophils Absolute: 0.3 10*3/uL (ref 0.0–0.7)
Eosinophils Relative: 4 % (ref 0.0–5.0)
HCT: 45.2 % (ref 39.0–52.0)
Lymphs Abs: 2.3 10*3/uL (ref 0.7–4.0)
MCHC: 34.4 g/dL (ref 30.0–36.0)
MCV: 91 fl (ref 78.0–100.0)
Monocytes Absolute: 1.5 10*3/uL — ABNORMAL HIGH (ref 0.1–1.0)
Platelets: 305 10*3/uL (ref 150.0–400.0)
RBC: 4.96 Mil/uL (ref 4.22–5.81)
WBC: 7.9 10*3/uL (ref 4.5–10.5)

## 2013-02-17 LAB — PROTIME-INR: INR: 1 ratio (ref 0.8–1.0)

## 2013-02-17 NOTE — Telephone Encounter (Signed)
02/17/13 Error.

## 2013-02-18 ENCOUNTER — Ambulatory Visit (HOSPITAL_COMMUNITY)
Admission: RE | Admit: 2013-02-18 | Discharge: 2013-02-18 | Disposition: A | Discharge status: Home / Self Care | Payer: Medicare Other | Source: Ambulatory Visit | Attending: Cardiovascular Disease | Admitting: Cardiovascular Disease

## 2013-02-18 ENCOUNTER — Encounter (HOSPITAL_COMMUNITY)
Admission: RE | Disposition: A | Discharge status: Home / Self Care | Payer: Self-pay | Source: Ambulatory Visit | Attending: Cardiovascular Disease

## 2013-02-18 ENCOUNTER — Other Ambulatory Visit: Payer: Self-pay | Admitting: *Deleted

## 2013-02-18 DIAGNOSIS — I77810 Thoracic aortic ectasia: Secondary | ICD-10-CM | POA: Insufficient documentation

## 2013-02-18 DIAGNOSIS — Z9861 Coronary angioplasty status: Secondary | ICD-10-CM | POA: Insufficient documentation

## 2013-02-18 DIAGNOSIS — Z79899 Other long term (current) drug therapy: Secondary | ICD-10-CM | POA: Insufficient documentation

## 2013-02-18 DIAGNOSIS — R0989 Other specified symptoms and signs involving the circulatory and respiratory systems: Secondary | ICD-10-CM | POA: Insufficient documentation

## 2013-02-18 DIAGNOSIS — I2510 Atherosclerotic heart disease of native coronary artery without angina pectoris: Secondary | ICD-10-CM | POA: Insufficient documentation

## 2013-02-18 DIAGNOSIS — Z87891 Personal history of nicotine dependence: Secondary | ICD-10-CM | POA: Insufficient documentation

## 2013-02-18 DIAGNOSIS — Z7982 Long term (current) use of aspirin: Secondary | ICD-10-CM | POA: Insufficient documentation

## 2013-02-18 DIAGNOSIS — R0600 Dyspnea, unspecified: Secondary | ICD-10-CM

## 2013-02-18 DIAGNOSIS — Z7902 Long term (current) use of antithrombotics/antiplatelets: Secondary | ICD-10-CM | POA: Insufficient documentation

## 2013-02-18 DIAGNOSIS — R0609 Other forms of dyspnea: Secondary | ICD-10-CM | POA: Insufficient documentation

## 2013-02-18 DIAGNOSIS — E785 Hyperlipidemia, unspecified: Secondary | ICD-10-CM | POA: Insufficient documentation

## 2013-02-18 HISTORY — PX: LEFT HEART CATHETERIZATION WITH CORONARY ANGIOGRAM: SHX5451

## 2013-02-18 SURGERY — LEFT HEART CATHETERIZATION WITH CORONARY ANGIOGRAM
Anesthesia: LOCAL

## 2013-02-18 MED ORDER — SODIUM CHLORIDE 0.9 % IJ SOLN: 3.0000 mL | INTRAMUSCULAR | Status: DC | PRN

## 2013-02-18 MED ORDER — ACETAMINOPHEN 325 MG PO TABS: 650.0000 mg | ORAL_TABLET | ORAL | Status: DC | PRN

## 2013-02-18 MED ORDER — LIDOCAINE HCL (PF) 1 % IJ SOLN
INTRAMUSCULAR | Status: AC
  Filled 2013-02-18: qty 30

## 2013-02-18 MED ORDER — DIAZEPAM 5 MG PO TABS
5.0000 mg | ORAL_TABLET | ORAL | Status: AC
  Administered 2013-02-18: 5 mg via ORAL
  Filled 2013-02-18: qty 1

## 2013-02-18 MED ORDER — SODIUM CHLORIDE 0.9 % IJ SOLN: 3.0000 mL | Freq: Two times a day (BID) | INTRAMUSCULAR | Status: DC

## 2013-02-18 MED ORDER — ASPIRIN 81 MG PO CHEW
324.0000 mg | CHEWABLE_TABLET | ORAL | Status: AC
  Administered 2013-02-18: 324 mg via ORAL
  Filled 2013-02-18: qty 4

## 2013-02-18 MED ORDER — MIDAZOLAM HCL 2 MG/2ML IJ SOLN
INTRAMUSCULAR | Status: AC
  Filled 2013-02-18: qty 2

## 2013-02-18 MED ORDER — SODIUM CHLORIDE 0.9 % IV SOLN
INTRAVENOUS | Status: DC
  Administered 2013-02-18: 06:00:00 via INTRAVENOUS

## 2013-02-18 MED ORDER — SODIUM CHLORIDE 0.9 % IV SOLN: 250.0000 mL | INTRAVENOUS | Status: DC | PRN

## 2013-02-18 MED ORDER — HEPARIN (PORCINE) IN NACL 2-0.9 UNIT/ML-% IJ SOLN
INTRAMUSCULAR | Status: AC
  Filled 2013-02-18: qty 1000

## 2013-02-18 MED ORDER — SODIUM CHLORIDE 0.9 % IV SOLN: INTRAVENOUS | Status: AC

## 2013-02-18 MED ORDER — FENTANYL CITRATE 0.05 MG/ML IJ SOLN
INTRAMUSCULAR | Status: AC
  Filled 2013-02-18: qty 2

## 2013-02-18 MED ORDER — ONDANSETRON HCL 4 MG/2ML IJ SOLN: 4.0000 mg | Freq: Four times a day (QID) | INTRAMUSCULAR | Status: DC | PRN

## 2013-02-18 MED ORDER — NITROGLYCERIN 0.2 MG/ML ON CALL CATH LAB
INTRAVENOUS | Status: AC
  Filled 2013-02-18: qty 1

## 2013-02-18 NOTE — CV Procedure (Signed)
   Cardiac Catheterization Operative Report  Martin [name] [id number] 9/5/20148:06 AM Keturah Barre A, MD  Procedure Performed:  1. Left Heart Catheterization 2. Selective Coronary Angiography 3. Left ventricular angiogram  Operator: Verne Carrow, MD  Indication:  72 yo male with history of CAD, HLD, dilated aortic root, BPH, former tobacco abuse here today for cardiac follow up. He has been followed in the past by Dr. Kirke Corin. Stress test September 2011 with ischemia. Cath September 2011 with showed a 20% left main disease, 80% ostial LAD, and diffuse 70-80% proximal left anterior descending coronary artery disease, distal right coronary artery with 60% stenosis leading into a small PDA which was diffusely diseased and occluded distally. Ejection fraction was 55%. He underwent an angioplasty and drug-eluting stent placement to ostial and proximal left anterior descending coronary artery with a 3.0 x 28 mm Promus drug-eluting stent. Now with recent dyspnea with minimal exertion concerning for unstable angina.                                    Procedure Details: The risks, benefits, complications, treatment options, and expected outcomes were discussed with the patient. The patient and/or family concurred with the proposed plan, giving informed consent. The patient was brought to the cath lab after IV hydration was begun and oral premedication was given. The patient was further sedated with Versed and Fentanyl. There had been previous attempts in 2001 with right radial access which was not optimal for engaging the coronary arteries due to tortuosity of vessels. The right groin was prepped and draped in the usual manner. Using the modified Seldinger access technique, a 5 French sheath was placed in the right femoral artery. Standard diagnostic catheters were used to perform selective coronary angiography. A pigtail catheter was used to perform a left ventricular angiogram.  There were no  immediate complications. The patient was taken to the recovery area in stable condition.   Hemodynamic Findings: Central aortic pressure:  124/63 Left ventricular pressure: 122/2/7  Angiographic Findings:  Left main: 20% distal stenosis.   Left Anterior Descending Artery: Large caliber vessel that courses to the apex. There is a patent stent in the proximal vessel with no restenosis. The mid and distal vessel has mild plaque disease. There is a small caliber diagonal branch with mild ostial plaque.   Circumflex Artery: Moderate caliber vessel with moderate caliber first obtuse marginal branch. The first OM branch has 40% stenosis. The AV groove Circumflex becomes small caliber and has a mid to distal 50% stenosis which is unchanged.   Right Coronary Artery: Moderate caliber dominant vessel with 70% distal stenosis leading into a very small caliber diffusely diseased PDA. The PLA is small in caliber with mild plaque disease.   Left Ventricular Angiogram: LVEF=50%.   Impression: 1. Stable double vessel CAD 2. Low normal LV systolic function 3. Dyspnea likely non-cardiac.   Recommendations: Will continue medical management of CAD. Will refer to Pulmonary for workup for possible COPD.        Complications:  None. The patient tolerated the procedure well.

## 2013-02-18 NOTE — Interval H&P Note (Signed)
History and Physical Interval Note:  02/18/2013 6:40 AM  Martin [name]  has presented today for cardiac cath with the diagnosis of CAD/Chest pain  The various methods of treatment have been discussed with the patient and family. After consideration of risks, benefits and other options for treatment, the patient has consented to  Procedure(s): LEFT HEART CATHETERIZATION WITH CORONARY ANGIOGRAM (N/A) as a surgical intervention .  The patient's history has been reviewed, patient examined, no change in status, stable for surgery.  I have reviewed the patient's chart and labs.  Questions were answered to the patient's satisfaction.    Cath Lab Visit (complete for each Cath Lab visit)  Clinical Evaluation Leading to the Procedure:   ACS: no  Non-ACS:    Anginal Classification: CCS III  Anti-ischemic medical therapy: Minimal Therapy (1 class of medications)  Non-Invasive Test Results: No non-invasive testing performed  Prior CABG: No previous CABG        MCALHANY,CHRISTOPHER

## 2013-02-18 NOTE — H&P (View-Only) (Signed)
 History of Present Illness: [name] is a 72 yo WM with h/o CAD, HLD, dilated aortic root, BPH, former tobacco abuse here today for cardiac follow up. He has been followed in the past by Dr. Arida. Stress test September 2011 with ischemia. Cath September 2011 with showed a 20% left main disease, 80% ostial LAD, and diffuse 70-80% proximal left anterior descending coronary artery disease, distal right coronary artery with 60% stenosis leading into a small PDA which was diffusely diseased and occluded distally. Ejection fraction was 55%. He underwent an angioplasty and drug-eluting stent placement to ostial and proximal left anterior descending coronary artery with a 3.0 x 28 mm Promus drug-eluting stent.   He is here today for follow up. He has been having exertional dyspnea similar to prior angina. No chest pain, palpitations. He has stopped smoking. Overall energy level is poor. He is a long distance truck driver. No pain in arms and legs.   Primary Care Physician: Robert Robbins  Last Lipid Profile:Lipid Panel     Component Value Date/Time   CHOL 202* 12/16/2011 0834   TRIG 91.0 12/16/2011 0834   HDL 48.00 12/16/2011 0834   CHOLHDL 4 12/16/2011 0834   VLDL 18.2 12/16/2011 0834    Past Medical History  Diagnosis Date  . BPH (benign prostatic hyperplasia)   . Dilated aortic root     Ascending aorta (noted during cath)  . CAD (coronary artery disease)   . HLD (hyperlipidemia)     Past Surgical History  Procedure Laterality Date  . Cardiac catheterization  02/2010    20% left main, 80% ostial/proximal LAD, 60% distal RCA leading into a small PDA which is occluded distally  . Coronary angioplasty  02/2010    LAD: 3.0 X 28 mm Promus DES    Current Outpatient Prescriptions  Medication Sig Dispense Refill  . aspirin 81 MG tablet Take 81 mg by mouth daily.        . clopidogrel (PLAVIX) 75 MG tablet TAKE 1 TABLET (75 MG TOTAL) BY MOUTH DAILY.  30 tablet  5  . metoprolol tartrate (LOPRESSOR)  25 MG tablet       . Tamsulosin HCl (FLOMAX) 0.4 MG CAPS Take 0.4 mg by mouth daily.        . atorvastatin (LIPITOR) 80 MG tablet Take 1 tablet (80 mg total) by mouth daily.  30 tablet  6   No current facility-administered medications for this visit.    No Known Allergies  History   Social History  . Marital Status: Married    Spouse Name: N/A    Number of Children: 1  . Years of Education: N/A   Occupational History  . truck driver    Social History Main Topics  . Smoking status: Former Smoker -- 0.20 packs/day for 50 years    Types: Cigarettes    Quit date: 03/04/2010  . Smokeless tobacco: Not on file  . Alcohol Use: Yes     Comment: beer on weekend  . Drug Use: No  . Sexual Activity: Not on file   Other Topics Concern  . Not on file   Social History Narrative  . No narrative on file    Family History  Problem Relation Age of Onset  . Cancer    . Heart disease    . Aneurysm      thoracic rupture    Review of Systems:  As stated in the HPI and otherwise negative.   BP 140/92  Pulse   61  Ht 5' 7" (1.702 m)  Wt 174 lb (78.926 kg)  BMI 27.25 kg/m2  Physical Examination: General: Well developed, well nourished, NAD HEENT: OP clear, mucus membranes moist SKIN: warm, dry. No rashes. Neuro: No focal deficits Musculoskeletal: Muscle strength 5/5 all ext Psychiatric: Mood and affect normal Neck: No JVD, no carotid bruits, no thyromegaly, no lymphadenopathy. Lungs:Clear bilaterally, no wheezes, rhonci, crackles Cardiovascular: Regular rate and rhythm. No murmurs, gallops or rubs. Abdomen:Soft. Bowel sounds present. Non-tender.  Extremities: No lower extremity edema. Pulses are 2 + in the bilateral DP/PT.  EKG: Sinus, rate 61 bpm. PVC. Inferior Q wave.   Assessment and Plan:   1. CAD: Recent exertional dyspnea and fatigue with minimal exertion similar to prior angina. This is worrisome for unstable angina. Will arrange left heart cath on 02/18/13 in the main  cath lab at [place]. Risks and benefits reviewed. Right groin approach as he has tortuous subclavian arteries and failed right radial approach 2011. Will continue ASA/Plavix/beta blocker/statin.   2. Hyperlipidemia: On a statin  3. PVCs: Noted on EKG today. Asymptomatic. Continue beta blocker.   

## 2013-02-20 ENCOUNTER — Other Ambulatory Visit: Payer: Self-pay | Admitting: Cardiovascular Disease

## 2013-03-25 ENCOUNTER — Ambulatory Visit (INDEPENDENT_AMBULATORY_CARE_PROVIDER_SITE_OTHER)
Admission: RE | Admit: 2013-03-25 | Discharge: 2013-03-25 | Disposition: A | Discharge status: Home / Self Care | Payer: Medicare Other | Source: Ambulatory Visit | Attending: Pulmonary Disease | Admitting: Pulmonary Disease

## 2013-03-25 ENCOUNTER — Ambulatory Visit (INDEPENDENT_AMBULATORY_CARE_PROVIDER_SITE_OTHER): Payer: Medicare Other | Admitting: Pulmonary Disease

## 2013-03-25 ENCOUNTER — Encounter: Payer: Self-pay | Admitting: Pulmonary Disease

## 2013-03-25 VITALS — BP 120/72 | HR 70 | Ht 66.0 in | Wt 173.2 lb

## 2013-03-25 DIAGNOSIS — R0609 Other forms of dyspnea: Secondary | ICD-10-CM

## 2013-03-25 DIAGNOSIS — R0600 Dyspnea, unspecified: Secondary | ICD-10-CM

## 2013-03-25 DIAGNOSIS — R0989 Other specified symptoms and signs involving the circulatory and respiratory systems: Secondary | ICD-10-CM

## 2013-03-25 DIAGNOSIS — R0602 Shortness of breath: Secondary | ICD-10-CM

## 2013-03-25 MED ORDER — TIOTROPIUM BROMIDE MONOHYDRATE 18 MCG IN CAPS: 18.0000 ug | ORAL_CAPSULE | Freq: Every day | RESPIRATORY_TRACT | Status: DC

## 2013-03-25 NOTE — Progress Notes (Signed)
Subjective:    Patient ID: Martin [name], male    DOB: Jun 21, 1940, 72 y.o.   MRN: [id number]  HPI   72 yo WM heavy ex-smoker, presents for evaluation of dyspnea. PMH CAD, HLD, dilated aortic root, BPH In September 2011 ,he underwent an angioplasty and drug-eluting stent placement to ostial and proximal left anterior descending coronary artery.  Ejection fraction was 55%.   He underwent repeat left heart cath on 02/18/13. 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 He reports a dry cough for 2 years with intermittent throat clearing and occasional heartburn. He denies childhood history of asthma or wheezing. Is no history of frequent chest colds. He started smoking as a teenager and smoked a maximum of 3 packs per day until he quit in 2011, more than 100 pack years.  Spirometry FEv1 64%, FVC 60%, ratio nml- surprisingly no obstruction  Chest x-ray showed mild cardiomegaly and right basal scarring.  Past Medical History  Diagnosis Date  . BPH (benign prostatic hyperplasia)   . Dilated aortic root     Ascending aorta (noted during cath)  . CAD (coronary artery disease)   . HLD (hyperlipidemia)     Past Surgical History  Procedure Laterality Date  . Cardiac catheterization  02/2010    20% left main, 80% ostial/proximal LAD, 60% distal RCA leading into a small PDA which is occluded distally  . Coronary angioplasty  02/2010    LAD: 3.0 X 28 mm Promus DES    No Known Allergies  History   Social History  . Marital Status: Married    Spouse Name: N/A    Number of Children: 1  . Years of Education: N/A   Occupational History  . truck driver    Social History Main Topics  . Smoking status: Former Smoker -- 3.00 packs/day for 50 years    Types: Cigarettes    Quit date: 03/04/2010  . Smokeless tobacco: Never Used  . Alcohol Use: Yes     Comment: beer on weekend   . Drug Use: No  . Sexual Activity: Not on file   Other Topics Concern  . Not on file   Social History Narrative  . No narrative on file     Family History  Problem Relation Age of Onset  . Ovarian cancer Mother   . Heart disease Father   . Bone cancer Brother      Review of Systems  Constitutional: Negative for fever and unexpected weight change.  HENT: Positive for sneezing and trouble swallowing. Negative for congestion, dental problem, ear pain, nosebleeds, postnasal drip, rhinorrhea, sinus pressure and sore throat.   Eyes: Negative for redness and itching.  Respiratory: Positive for cough and shortness of breath. Negative for chest tightness and wheezing.   Cardiovascular: Negative for palpitations and leg swelling.  Gastrointestinal: Negative for nausea and vomiting.  Genitourinary: Negative for dysuria.  Musculoskeletal: Negative for joint swelling.  Skin: Negative for rash.  Neurological: Negative for headaches.  Hematological: Does not bruise/bleed easily.  Psychiatric/Behavioral: Negative for dysphoric mood. The patient is not nervous/anxious.        Objective:   Physical Exam  Gen. Pleasant, well-nourished, in no distress, normal affect ENT - no lesions, no post nasal drip Neck: No JVD, no thyromegaly, no carotid bruits Lungs: kyphotic, no use of accessory muscles, no dullness to percussion, decreased without rales or rhonchi  Cardiovascular: Rhythm regular, heart sounds  normal, no murmurs or gallops, no peripheral edema Abdomen: soft and non-tender, no hepatosplenomegaly, BS normal. Musculoskeletal: No deformities, no cyanosis or clubbing Neuro:  alert, non focal       Assessment & Plan:

## 2013-03-25 NOTE — Assessment & Plan Note (Signed)
FEv1 64%, FVC 60%, ratio nml- surprisingly no obstruction in this heavy ex smoker CXR today I offered him trial of bronchodilator but he defers for now Call us if breathing worse or if you ever get a chest cold Flu shot recommended but he denied Cough may be related to usual culprits such as postnasal drip or heartburn

## 2013-03-25 NOTE — Patient Instructions (Addendum)
lung capacity is at 64% CXR today Call us if breathing worse or if you ever get a chest cold Flu shot recommended

## 2013-03-28 ENCOUNTER — Telehealth: Payer: Self-pay | Admitting: Pulmonary Disease

## 2013-03-28 NOTE — Progress Notes (Signed)
Quick Note:   Pt aware of results. No questions or concerns.  ______

## 2013-03-28 NOTE — Telephone Encounter (Signed)
  Notes Recorded by Oretha Milch, MD on 03/25/2013 at 5:08 PM Mild scarring rt base   Pt aware of results. No questions or concerns.

## 2013-04-15 ENCOUNTER — Ambulatory Visit: Payer: Medicare Other | Admitting: Cardiovascular Disease

## 2013-04-23 ENCOUNTER — Other Ambulatory Visit: Payer: Self-pay | Admitting: Cardiovascular Disease

## 2013-05-09 ENCOUNTER — Other Ambulatory Visit: Payer: Self-pay

## 2013-05-09 MED ORDER — METOPROLOL TARTRATE 25 MG PO TABS: 25.0000 mg | ORAL_TABLET | Freq: Every day | ORAL | Status: DC

## 2013-05-10 ENCOUNTER — Other Ambulatory Visit: Payer: Self-pay | Admitting: *Deleted

## 2013-05-10 MED ORDER — ATORVASTATIN CALCIUM 80 MG PO TABS: ORAL_TABLET | ORAL | Status: DC

## 2014-01-14 ENCOUNTER — Other Ambulatory Visit: Payer: Self-pay | Admitting: Cardiovascular Disease

## 2014-01-22 ENCOUNTER — Other Ambulatory Visit: Payer: Self-pay | Admitting: Cardiovascular Disease

## 2014-03-06 ENCOUNTER — Encounter: Payer: Self-pay | Admitting: Physician Assistant

## 2014-03-06 ENCOUNTER — Ambulatory Visit (INDEPENDENT_AMBULATORY_CARE_PROVIDER_SITE_OTHER): Payer: Medicare Other | Admitting: Physician Assistant

## 2014-03-06 ENCOUNTER — Other Ambulatory Visit: Payer: Self-pay | Admitting: *Deleted

## 2014-03-06 ENCOUNTER — Encounter: Payer: Self-pay | Admitting: *Deleted

## 2014-03-06 VITALS — BP 142/88 | HR 61 | Ht 66.0 in | Wt 173.8 lb

## 2014-03-06 DIAGNOSIS — I209 Angina pectoris, unspecified: Secondary | ICD-10-CM

## 2014-03-06 DIAGNOSIS — L03119 Cellulitis of unspecified part of limb: Secondary | ICD-10-CM

## 2014-03-06 DIAGNOSIS — I25119 Atherosclerotic heart disease of native coronary artery with unspecified angina pectoris: Secondary | ICD-10-CM

## 2014-03-06 DIAGNOSIS — I25810 Atherosclerosis of coronary artery bypass graft(s) without angina pectoris: Secondary | ICD-10-CM

## 2014-03-06 DIAGNOSIS — L0390 Cellulitis, unspecified: Secondary | ICD-10-CM | POA: Insufficient documentation

## 2014-03-06 DIAGNOSIS — I2510 Atherosclerotic heart disease of native coronary artery without angina pectoris: Secondary | ICD-10-CM

## 2014-03-06 DIAGNOSIS — L02419 Cutaneous abscess of limb, unspecified: Secondary | ICD-10-CM

## 2014-03-06 MED ORDER — POTASSIUM CHLORIDE CRYS ER 20 MEQ PO TBCR: 20.0000 meq | EXTENDED_RELEASE_TABLET | Freq: Every day | ORAL | Status: DC

## 2014-03-06 MED ORDER — FUROSEMIDE 40 MG PO TABS: 40.0000 mg | ORAL_TABLET | Freq: Every day | ORAL | Status: DC

## 2014-03-06 NOTE — Progress Notes (Signed)
HPI: This is a 73 year old patient of Dr.McAlhany who has history of CAD status post drug-eluting stent to the ostial and proximal LAD in 2011. His last cath 02/2013 showed patent stent in the LAD with 40% OM 1 mid to distal 50% circumflex, 70% distal RCA ejection fraction 50%. He was having dyspnea at that time it was felt likely noncardiac and he was referred to pulmonary. FEV1 was 64% FVC 60% a surprisingly had no obstruction. A trial of bronchodilators was given. Chest x-ray show mild scarring at the right base.   He also has a history of a dilated aortic root, BPH and former tobacco abuse.  Patient comes in today complaining of 2 week history of bilateral leg swelling. He went to urgent care and was put on hydrochlorothiazide 25 mg once daily and Keflex. 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 He says the swelling hasn't gotten any better and the redness has not improved. He denies any chest pain, palpitations, dyspnea, dyspnea on exertion, dizziness or presyncope.  No Known Allergies   Current Outpatient Prescriptions  Medication Sig Dispense Refill  . aspirin 81 MG tablet Take 81 mg by mouth daily.       Marland Kitchen atorvastatin (LIPITOR) 80 MG tablet TAKE ONE TABLET BY MOUTH EVERY DAY  30 tablet  1  . clopidogrel (PLAVIX) 75 MG tablet TAKE 1 TABLET BY MOUTH EVERY DAY  30 tablet  5  . metoprolol tartrate (LOPRESSOR) 25 MG tablet Take 1 tablet (25 mg total) by mouth daily.  30 tablet  6  . Tamsulosin HCl (FLOMAX) 0.4 MG CAPS Take 0.4 mg by mouth daily.        No current facility-administered medications for this visit.    Past Medical History  Diagnosis Date  . BPH (benign prostatic hyperplasia)   . Dilated aortic root     Ascending aorta (noted during cath)  . CAD (coronary artery disease)   . HLD (hyperlipidemia)     Past Surgical History  Procedure Laterality Date  . Cardiac catheterization  02/2010    20% left main, 80%  ostial/proximal LAD, 60% distal RCA leading into a small PDA which is occluded distally  . Coronary angioplasty  02/2010    LAD: 3.0 X 28 mm Promus DES    Family History  Problem Relation Age of Onset  . Ovarian cancer Mother   . Heart disease Father   . Bone cancer Brother     History   Social History  . Marital Status: Married    Spouse Name: N/A    Number of Children: 1  . Years of Education: N/A   Occupational History  . truck driver    Social History Main Topics  . Smoking status: Former Smoker -- 3.00 packs/day for 50 years    Types: Cigarettes    Quit date: 03/04/2010  . Smokeless tobacco: Never Used  . Alcohol Use: Yes     Comment: beer on weekend  . Drug Use: No  . Sexual Activity: Not on file   Other Topics Concern  . Not on file   Social History Narrative  . No narrative on file    ROS: See history of present illness otherwise negative  There were no vitals taken for this visit.  PHYSICAL EXAM: Well-nournished, in no acute distress. Neck: No JVD, HJR, Bruit, or thyroid enlargement  Lungs: No tachypnea, clear without wheezing, rales, or rhonchi  Cardiovascular: RRR, PMI not displaced, heart sounds normal, no murmurs, gallops, bruit, thrill, or heave.  Abdomen: BS normal. Soft without organomegaly, masses, lesions or tenderness.  Extremities: Mild swelling bilaterally with erythema. Decreased distal pulses bilateral but present.  SKin: Warm, no lesions or rashes   Musculoskeletal: No deformities  Neuro: no focal signs   Wt Readings from Last 3 Encounters:  03/25/13 173 lb 3.2 oz (78.563 kg)  02/18/13 173 lb (78.472 kg)  02/18/13 173 lb (78.472 kg)     EKG: Sinus bradycardia with PVCs at 58 beats per minute   Cardiac catheterization 02/2013 Angiographic Findings:  Left main: 20% distal stenosis.    Left Anterior Descending Artery: Large caliber vessel that courses to the apex. There is a patent stent in the proximal vessel with no  restenosis. The mid and distal vessel has mild plaque disease. There is a small caliber diagonal branch with mild ostial plaque.    Circumflex Artery: Moderate caliber vessel with moderate caliber first obtuse marginal branch. The first OM branch has 40% stenosis. The AV groove Circumflex becomes small caliber and has a mid to distal 50% stenosis which is unchanged.    Right Coronary Artery: Moderate caliber dominant vessel with 70% distal stenosis leading into a very small caliber diffusely diseased PDA. The PLA is small in caliber with mild plaque disease.   Left Ventricular Angiogram: LVEF=50%.   Impression: 1. Stable double vessel CAD 2. Low normal LV systolic function 3. Dyspnea likely non-cardiac.    Recommendations: Will continue medical management of CAD. Will refer to Pulmonary for workup for possible COPD.          Complications:  None. The patient tolerated the procedure well.

## 2014-03-06 NOTE — Assessment & Plan Note (Signed)
Patient has cellulitis of both legs. He is on antibiotics. Dr. Angelena Form examined the patient as well.The swelling remains. We will stop his hydrochlorothiazide. Start Lasix 40 mg once daily potassium 20 mEq once daily. Followup with primary care. Check ABIs.

## 2014-03-06 NOTE — Assessment & Plan Note (Signed)
Stable without chest pain. Followup as previously arranged.

## 2014-03-06 NOTE — Patient Instructions (Signed)
Your physician has recommended you make the following change in your medication:    1. STOP HCTZ   2. START LASIX 40 MG DAILY  3. START POTASSIUM 20 MED DAILY  Your physician recommends that you schedule a follow-up appointment in:  WITH PCP AND ARTERIAL DOPPLER

## 2014-05-13 ENCOUNTER — Other Ambulatory Visit: Payer: Self-pay | Admitting: Cardiovascular Disease

## 2014-05-18 ENCOUNTER — Other Ambulatory Visit: Payer: Self-pay

## 2014-05-18 MED ORDER — METOPROLOL TARTRATE 25 MG PO TABS: 25.0000 mg | ORAL_TABLET | Freq: Every day | ORAL | Status: DC

## 2014-05-18 MED ORDER — ATORVASTATIN CALCIUM 80 MG PO TABS: 80.0000 mg | ORAL_TABLET | Freq: Every day | ORAL | Status: DC

## 2014-05-25 ENCOUNTER — Encounter (HOSPITAL_COMMUNITY): Payer: Self-pay | Admitting: Cardiovascular Disease

## 2014-08-14 ENCOUNTER — Other Ambulatory Visit: Payer: Self-pay | Admitting: Cardiovascular Disease

## 2014-09-04 ENCOUNTER — Ambulatory Visit (INDEPENDENT_AMBULATORY_CARE_PROVIDER_SITE_OTHER): Payer: PPO | Admitting: Cardiology

## 2014-09-04 ENCOUNTER — Encounter: Payer: Self-pay | Admitting: Cardiology

## 2014-09-04 VITALS — BP 142/72 | HR 73 | Ht 67.0 in | Wt 173.2 lb

## 2014-09-04 DIAGNOSIS — I2510 Atherosclerotic heart disease of native coronary artery without angina pectoris: Secondary | ICD-10-CM

## 2014-09-04 NOTE — Progress Notes (Signed)
Cardiology Office Note   Date:  09/04/2014   ID:  Martin [name], Martin [name] 19-Sep-1940, MRN [id number]  PCP:  Martin Broker, MD  Cardiologist:  Dr. Angelena [name]    Chief Complaint  Patient presents with  . Other    surgical clearence for Esophageal stretch. planning to preform next week or two depending on cardiac clearence.       History of Present Illness: Martin [name] is a 74 y.o. male who presents for surgical clearance for esophageal surgery.   He has history of CAD status post drug-eluting stent to the ostial and proximal LAD in 2011. His last cath 02/2013 showed patent stent in the LAD with 40% OM 1 mid to distal 50% circumflex, 70% distal RCA ejection fraction 50%. He was having dyspnea at that time it was felt likely noncardiac and he was referred to pulmonary. FEV1 was 64% FVC 60% a surprisingly had no obstruction. A trial of bronchodilators was given. Chest x-ray show mild scarring at the right base.  He also has a history of a dilated aortic root, BPH and former tobacco abuse. He had lower ext edema in Sept and was to have arterial dopplers that he did not have.    Today he presents for cardiac clearance for esophageal dilatation.  Has had problems swallowing.  He denies chest pain or SOB.  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  No palpitations.  He will need to be off plavix 5 days prior to procedure.    Past Medical History  Diagnosis Date  . BPH (benign prostatic hyperplasia)   . Dilated aortic root     Ascending aorta (noted during cath)  . CAD (coronary artery disease)   . HLD (hyperlipidemia)     Past Surgical History  Procedure Laterality Date  . Cardiac catheterization  02/2010    20% left main, 80% ostial/proximal LAD, 60% distal RCA leading into a small PDA which is occluded distally  . Coronary angioplasty  02/2010    LAD: 3.0 X 28 mm Promus DES  . Left heart catheterization with coronary angiogram N/A 02/18/2013   Procedure: LEFT HEART CATHETERIZATION WITH CORONARY ANGIOGRAM;  Surgeon: Martin Blanks, MD;  Location: [facility] CATH LAB;  Service: Cardiovascular;  Laterality: N/A;     Current Outpatient Prescriptions  Medication Sig Dispense Refill  . aspirin 81 MG tablet Take 81 mg by mouth daily.     Marland Kitchen atorvastatin (LIPITOR) 80 MG tablet TAKE 1 TABLET BY MOUTH DAILY 30 tablet 6  . cephALEXin (KEFLEX) 500 MG capsule Take 500 mg by mouth 2 (two) times daily.    . clopidogrel (PLAVIX) 75 MG tablet TAKE 1 TABLET BY MOUTH EVERY DAY 30 tablet 5  . furosemide (LASIX) 40 MG tablet Take 1 tablet (40 mg total) by mouth daily. 30 tablet 5  . metoprolol tartrate (LOPRESSOR) 25 MG tablet Take 1 tablet (25 mg total) by mouth daily. 30 tablet 6  . potassium chloride SA (KLOR-CON M20) 20 MEQ tablet Take 1 tablet (20 mEq total) by mouth daily. 30 tablet 5   No current facility-administered medications for this visit.    Allergies:   Review of patient's allergies indicates no known allergies.    Social History:  The patient  reports that he quit smoking about 4 years ago. His smoking use included Cigarettes. He has a 150 pack-year smoking history. He has never used smokeless tobacco. He reports that he drinks alcohol. He reports  that he does not use illicit drugs.   Family History:  The patient's family history includes Bone cancer in his brother; Heart disease in his father; Ovarian cancer in his mother.    ROS:  General:no colds or fevers, no weight changes Skin:no rashes or ulcers HEENT:no blurred vision, no congestion CV:see HPI PUL:see HPI GI:no diarrhea constipation or melena, no indigestion/ difficulty swallowing GU:no hematuria, no dysuria MS:no joint pain, no claudication, no further edema Neuro:no syncope, no lightheadedness Endo:no diabetes, no thyroid disease  Wt Readings from Last 3 Encounters:  09/04/14 173 lb 3.2 oz (78.563 kg)  03/06/14 173 lb 12.8 oz (78.835 kg)  03/25/13 173 lb 3.2 oz  (78.563 kg)     PHYSICAL EXAM: VS:  BP 142/72 mmHg  Pulse 73  Ht 5\' 7"  (1.702 m)  Wt 173 lb 3.2 oz (78.563 kg)  BMI 27.12 kg/m2 , BMI Body mass index is 27.12 kg/(m^2). General:Pleasant affect, NAD Skin:Warm and dry, brisk capillary refill HEENT:normocephalic, sclera clear, mucus membranes moist Neck:supple, no JVD, no bruits  Heart:S1S2 RRR without murmur, gallup, rub or click Lungs:clear without rales, rhonchi, or wheezes KTG:YBWL, non tender, + BS, do not palpate liver spleen or masses Ext:no lower ext edema, 2+ pedal pulses, 2+ radial pulses Neuro:alert and oriented, MAE, follows commands, + facial symmetry    EKG:  EKG is ordered today. The ekg ordered today demonstrates SR rate of 73 with PVC , old inf. Q waves.  No acute changes.   Recent Labs: No results found for requested labs within last 365 days.    Lipid Panel    Component Value Date/Time   CHOL 202* 12/16/2011 0834   TRIG 91.0 12/16/2011 0834   HDL 48.00 12/16/2011 0834   CHOLHDL 4 12/16/2011 0834   VLDL 18.2 12/16/2011 0834   LDLDIRECT 131.1 12/16/2011 0834       Other studies Reviewed: Additional studies/ records that were reviewed today include: cardiac caths and previous notes.   ASSESSMENT AND PLAN:  1.  Cardiac clearance for esophageal dilatation.  Ok to proceed with dilatation, low risk.  No chest pain and no EKG changes.  Discussed with Dr. Ellyn Hack.  OK to stop plavix ASA for 5 days prior to procedure.  We would like pt back on meds ASAP afterward due to placement of stent prox LAD.  Pt is aware. We will have pt follow up with Dr. Angelena [name] in 3 months.   2. CAD with Stent to ostial LAD 2011.  Ok to stop Plavix for procedure.  3.  HTN   Stable to slightly elevated.   4. Dyslipidemia followed by PCP.      Current medicines are reviewed with the patient today.  The patient Has no concerns regarding medicines.  The following changes have been made:  See above Labs/ tests ordered today  include:see above  Disposition:   FU:  see above  Lennie Muckle, NP  09/04/2014 3:39 PM    [facility] [place], [place], [place], [place] Phone: [phone]; Fax: [phone]

## 2014-09-04 NOTE — Patient Instructions (Addendum)
You have been cleared for surgery.  Stop taking your Plavix 5 days prior to surgery. You may resume this as well as your Aspirin as instructed by your surgeon.   Your physician recommends that you schedule a follow-up appointment in: 3 months with Dr.McAlhany

## 2014-10-17 ENCOUNTER — Other Ambulatory Visit: Payer: Self-pay | Admitting: Cardiovascular Disease

## 2014-10-27 ENCOUNTER — Other Ambulatory Visit: Payer: Self-pay

## 2014-10-27 MED ORDER — METOPROLOL TARTRATE 25 MG PO TABS: 25.0000 mg | ORAL_TABLET | Freq: Every day | ORAL | Status: DC

## 2014-12-27 ENCOUNTER — Other Ambulatory Visit: Payer: Self-pay | Admitting: Cardiovascular Disease

## 2015-02-28 ENCOUNTER — Ambulatory Visit (INDEPENDENT_AMBULATORY_CARE_PROVIDER_SITE_OTHER): Payer: PPO | Admitting: Cardiovascular Disease

## 2015-02-28 ENCOUNTER — Encounter: Payer: Self-pay | Admitting: Cardiovascular Disease

## 2015-02-28 VITALS — BP 110/78 | HR 55 | Ht 67.0 in | Wt 174.0 lb

## 2015-02-28 DIAGNOSIS — I2510 Atherosclerotic heart disease of native coronary artery without angina pectoris: Secondary | ICD-10-CM | POA: Diagnosis not present

## 2015-02-28 DIAGNOSIS — E785 Hyperlipidemia, unspecified: Secondary | ICD-10-CM | POA: Diagnosis not present

## 2015-02-28 DIAGNOSIS — I5032 Chronic diastolic (congestive) heart failure: Secondary | ICD-10-CM | POA: Diagnosis not present

## 2015-02-28 MED ORDER — FUROSEMIDE 40 MG PO TABS: 40.0000 mg | ORAL_TABLET | Freq: Every day | ORAL | Status: DC

## 2015-02-28 MED ORDER — ATORVASTATIN CALCIUM 80 MG PO TABS: 80.0000 mg | ORAL_TABLET | Freq: Every day | ORAL | Status: DC

## 2015-02-28 NOTE — Patient Instructions (Addendum)
Medication Instructions:  Your physician has recommended you make the following change in your medication:  Stop Aspirin. Start furosemide 40 mg by mouth daily    Labwork: Your physician recommends that you return for fasting lab work later this week or next week--Lipid and liver profiles and BMP.  You can have this done in [place].  You have prescription for this.    Testing/Procedures: none  Follow-Up: Your physician wants you to follow-up in: 6 months.  You will receive a reminder letter in the mail two months in advance. If you don't receive a letter, please call our office to schedule the follow-up appointment.   Any Other Special Instructions Will Be Listed Below (If Applicable).

## 2015-02-28 NOTE — Progress Notes (Signed)
Chief Complaint  Patient presents with  . Leg Swelling  . Fatigue      History of Present Illness: 74 yo WM with h/o CAD, HLD, dilated aortic root, BPH, former tobacco abuse here today for cardiac follow up. He has been followed in the past by Dr. Fletcher [name]. Stress test September 2011 with ischemia. Cath September 2011 with showed a 20% left main disease, 80% ostial LAD, and diffuse 70-80% proximal left anterior descending coronary artery disease, distal right coronary artery with 60% stenosis leading into a small PDA which was diffusely diseased and occluded distally. Ejection fraction was 55%. He underwent PCI with placement of a drug-eluting stent in the ostial and proximal left anterior descending coronary artery with a 3.0 x 28 mm Promus drug-eluting stent. Cardiac cath September 2014 with stable CAD. Seen here in our office September 2015 with c/o LE edema and cellulitis. He was seen by Martin Husk, PA-C. He was started on Lasix and treated with antibiotics.   He is here today for follow up. No chest pain, or palpitations. He does have mild exertional dyspnea and some LE edema. Has not been taking Lasix. He stopped smoking in 2011. He is a long distance Administrator.   Primary Care Physician: Martin [name]   Past Medical History  Diagnosis Date  . BPH (benign prostatic hyperplasia)   . Dilated aortic root     Ascending aorta (noted during cath)  . CAD (coronary artery disease)   . HLD (hyperlipidemia)     Past Surgical History  Procedure Laterality Date  . Cardiac catheterization  02/2010    20% left main, 80% ostial/proximal LAD, 60% distal RCA leading into a small PDA which is occluded distally  . Coronary angioplasty  02/2010    LAD: 3.0 X 28 mm Promus DES  . Left heart catheterization with coronary angiogram N/A 02/18/2013    Procedure: LEFT HEART CATHETERIZATION WITH CORONARY ANGIOGRAM;  Surgeon: Martin Blanks, MD;  Location: [facility] CATH LAB;  Service: Cardiovascular;   Laterality: N/A;    Current Outpatient Prescriptions  Medication Sig Dispense Refill  . atorvastatin (LIPITOR) 80 MG tablet Take 1 tablet (80 mg total) by mouth daily. 30 tablet 11  . clopidogrel (PLAVIX) 75 MG tablet TAKE 1 TABLET BY MOUTH EVERY DAY 30 tablet 0  . furosemide (LASIX) 40 MG tablet Take 1 tablet (40 mg total) by mouth daily. 30 tablet 11  . metoprolol tartrate (LOPRESSOR) 25 MG tablet Take 1 tablet (25 mg total) by mouth daily. 30 tablet 3  . potassium chloride SA (KLOR-CON M20) 20 MEQ tablet Take 1 tablet (20 mEq total) by mouth daily. 30 tablet 5   No current facility-administered medications for this visit.    No Known Allergies  Social History   Social History  . Marital Status: Married    Spouse Name: N/A  . Number of Children: 1  . Years of Education: N/A   Occupational History  . truck driver    Social History Main Topics  . Smoking status: Former Smoker -- 3.00 packs/day for 50 years    Types: Cigarettes    Quit date: 03/04/2010  . Smokeless tobacco: Never Used  . Alcohol Use: Yes     Comment: beer on weekend  . Drug Use: No  . Sexual Activity: Not on file   Other Topics Concern  . Not on file   Social History Narrative    Family History  Problem Relation Age of Onset  . Ovarian  cancer Mother   . Heart disease Father   . Bone cancer Brother   . Heart attack Father   . Hypertension Neg Hx   . Stroke Neg Hx     Review of Systems:  As stated in the HPI and otherwise negative.   BP 110/78 mmHg  Pulse 55  Ht 5\' 7"  (1.702 m)  Wt 174 lb (78.926 kg)  BMI 27.25 kg/m2  SpO2 99%  Physical Examination: General: Well developed, well nourished, NAD HEENT: OP clear, mucus membranes moist SKIN: warm, dry. No rashes. Neuro: No focal deficits Musculoskeletal: Muscle strength 5/5 all ext Psychiatric: Mood and affect normal Neck: No JVD, no carotid bruits, no thyromegaly, no lymphadenopathy. Lungs:Clear bilaterally, no wheezes, rhonci,  crackles Cardiovascular: Regular rate and rhythm. No murmurs, gallops or rubs. Abdomen:Soft. Bowel sounds present. Non-tender.  Extremities: Trace bilateral lower extremity edema. Pulses are 2 + in the bilateral DP/PT.  Cardiac cath 02/18/13: Left main: 20% distal stenosis.  Left Anterior Descending Artery: Large caliber vessel that courses to the apex. There is a patent stent in the proximal vessel with no restenosis. The mid and distal vessel has mild plaque disease. There is a small caliber diagonal branch with mild ostial plaque.  Circumflex Artery: Moderate caliber vessel with moderate caliber first obtuse marginal branch. The first OM branch has 40% stenosis. The AV groove Circumflex becomes small caliber and has a mid to distal 50% stenosis which is unchanged.  Right Coronary Artery: Moderate caliber dominant vessel with 70% distal stenosis leading into a very small caliber diffusely diseased PDA. The PLA is small in caliber with mild plaque disease.  Left Ventricular Angiogram: LVEF=50%.  Impression: 1. Stable double vessel CAD 2. Low normal LV systolic function 3. Dyspnea likely non-cardiac.   EKG:  EKG is not ordered today. The ekg ordered today demonstrates   Recent Labs: No results found for requested labs within last 365 days.   Lipid Panel    Component Value Date/Time   CHOL 202* 12/16/2011 0834   TRIG 91.0 12/16/2011 0834   HDL 48.00 12/16/2011 0834   CHOLHDL 4 12/16/2011 0834   VLDL 18.2 12/16/2011 0834   LDLDIRECT 131.1 12/16/2011 0834     Wt Readings from Last 3 Encounters:  02/28/15 174 lb (78.926 kg)  09/04/14 173 lb 3.2 oz (78.563 kg)  03/06/14 173 lb 12.8 oz (78.835 kg)     Other studies Reviewed: Additional studies/ records that were reviewed today include: . Review of the above records demonstrates:   Assessment and Plan:   1. CAD: Stable. Cardiac cath September 2014 with patent LAD stent and stable disease in the RCA and Circumflex. Will  continue Plavix/beta blocker/statin. Will stop ASA with easy bruising. I would like to continue Plavix since he has an ostial LAD stent.   2. Hyperlipidemia: On a statin. Will come back for fasting lipids and CMET.   3. PVCs: Asymptomatic. Continue beta blocker.   4. Chronic diastolic CHF: He has slight worsening dyspnea but has no chest pain. I think this is related to mild volume overload. Will restart Lasix 40 mg po every other day.   Current medicines are reviewed at length with the patient today.  The patient does not have concerns regarding medicines.  The following changes have been made:  Lasix restarted.   Labs/ tests ordered today include:  No orders of the defined types were placed in this encounter.    Disposition:   FU with me in 6 months  Signed, Lauree Chandler, MD 02/28/2015 5:26 PM    [facility] [address], [place], [place]  [address] Phone: [phone]; Fax: [phone]

## 2015-04-27 ENCOUNTER — Encounter: Payer: Self-pay | Admitting: Cardiovascular Disease

## 2015-05-20 ENCOUNTER — Other Ambulatory Visit: Payer: Self-pay | Admitting: Cardiovascular Disease

## 2015-05-21 NOTE — Telephone Encounter (Signed)
[name], MD at 02/28/2015 1:01 PM  clopidogrel (PLAVIX) 75 MG tabletTAKE 1 TABLET BY MOUTH EVERY DAY 1. CAD: Stable. Cardiac cath September 2014 with patent LAD stent and stable disease in the RCA and Circumflex. Will continue Plavix/beta blocker/statin. Will stop ASA with easy bruising. I would like to continue Plavix since he has an ostial LAD stent.

## 2015-05-21 NOTE — Telephone Encounter (Signed)
New message     *STAT* If patient is at the pharmacy, call can be transferred to refill team.   1. Which medications need to be refilled? (please list name of each medication and dose if known) plavix 75 mg   2. Which pharmacy/location (including street and city if local pharmacy) is medication to be sent to? [facility] in [place] [phone]   3. Do they need a 30 day or 90 day supply? 90 day supply

## 2015-08-31 ENCOUNTER — Other Ambulatory Visit: Payer: Self-pay | Admitting: Cardiovascular Disease

## 2015-10-03 NOTE — Progress Notes (Signed)
error 

## 2015-10-04 ENCOUNTER — Encounter: Payer: PPO | Admitting: Cardiovascular Disease

## 2015-10-12 DIAGNOSIS — H6123 Impacted cerumen, bilateral: Secondary | ICD-10-CM | POA: Diagnosis not present

## 2015-10-12 DIAGNOSIS — J0100 Acute maxillary sinusitis, unspecified: Secondary | ICD-10-CM | POA: Diagnosis not present

## 2015-10-16 ENCOUNTER — Encounter: Payer: Self-pay | Admitting: Cardiovascular Disease

## 2015-10-19 DIAGNOSIS — H9319 Tinnitus, unspecified ear: Secondary | ICD-10-CM | POA: Diagnosis not present

## 2015-10-24 DIAGNOSIS — H9313 Tinnitus, bilateral: Secondary | ICD-10-CM | POA: Diagnosis not present

## 2015-10-24 DIAGNOSIS — H903 Sensorineural hearing loss, bilateral: Secondary | ICD-10-CM | POA: Diagnosis not present

## 2015-11-10 DIAGNOSIS — J209 Acute bronchitis, unspecified: Secondary | ICD-10-CM | POA: Diagnosis not present

## 2015-11-10 DIAGNOSIS — J019 Acute sinusitis, unspecified: Secondary | ICD-10-CM | POA: Diagnosis not present

## 2015-11-13 ENCOUNTER — Encounter: Payer: Self-pay | Admitting: Physician Assistant

## 2015-11-13 ENCOUNTER — Ambulatory Visit (INDEPENDENT_AMBULATORY_CARE_PROVIDER_SITE_OTHER): Payer: PPO | Admitting: Physician Assistant

## 2015-11-13 VITALS — BP 130/72 | HR 64 | Ht 67.0 in | Wt 174.0 lb

## 2015-11-13 DIAGNOSIS — E785 Hyperlipidemia, unspecified: Secondary | ICD-10-CM

## 2015-11-13 DIAGNOSIS — R0600 Dyspnea, unspecified: Secondary | ICD-10-CM | POA: Diagnosis not present

## 2015-11-13 DIAGNOSIS — I2510 Atherosclerotic heart disease of native coronary artery without angina pectoris: Secondary | ICD-10-CM

## 2015-11-13 DIAGNOSIS — I5032 Chronic diastolic (congestive) heart failure: Secondary | ICD-10-CM

## 2015-11-13 NOTE — Patient Instructions (Signed)
Medication Instructions:  Your physician recommends that you continue on your current medications as directed. Please refer to the Current Medication list given to you today.   Labwork: None ordered  Testing/Procedures: Your physician has requested that you have a lexiscan myoview. For further information please visit [email or web address]. Please follow instruction sheet, as given.   Follow-Up: Your physician recommends that you schedule a follow-up appointment after test with Estella Husk, PA a day that Dr.Mcalhany is in the office   Any Other Special Instructions Will Be Listed Below (If Applicable).     If you need a refill on your cardiac medications before your next appointment, please call your pharmacy.

## 2015-11-13 NOTE — Progress Notes (Signed)
Cardiology Office Note    Date:  11/13/2015   ID:  Martin [name] [birth year], MRN [id number]  PCP:  Martin Broker, MD  Cardiologist: Dr. Angelena [name]    History of Present Illness:  Martin [name] is a 75 y.o. male with h/o CAD, HLD, dilated aortic root, Chronic diastolic heart failure, BPH, former tobacco abuse here today for cardiac follow up. He has been followed in the past by Dr. Fletcher [name]. Stress test September 2011 with ischemia. Cath September 2011 with showed a 20% left main disease, 80% ostial LAD, and diffuse 70-80% proximal left anterior descending coronary artery disease, distal right coronary artery with 60% stenosis leading into a small PDA which was diffusely diseased and occluded distally. Ejection fraction was 55%. He underwent PCI with placement of a drug-eluting stent in the ostial and proximal left anterior descending coronary artery with a 3.0 x 28 mm Promus drug-eluting stent. Cardiac cath September 2014 with stable CAD. Seen here in our office September 2015 with c/o LE edema and cellulitis He was started on Lasix and treated with antibiotics. Last seen by Martin [name] 02/2015 and had slight worsening dyspnea but no chest pain. He wasn't taking his Lasix so it was restarted at 40 mg every other day.  Retired 2 months ago. Recently complains of giving out when walking to the mailbox or doing any little activity. Has to stop to rest. Gets short of breath but no chest tightness. Same symptoms as prior stent. 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 Says he can't walk on a treadmill.    Past Medical History  Diagnosis Date  . BPH (benign prostatic hyperplasia)   . Dilated aortic root (HCC)     Ascending aorta (noted during cath)  . CAD (coronary artery disease)   . HLD (hyperlipidemia)     Past Surgical History  Procedure Laterality Date  . Cardiac catheterization  02/2010    20% left main, 80% ostial/proximal LAD, 60% distal RCA leading into a  small PDA which is occluded distally  . Coronary angioplasty  02/2010    LAD: 3.0 X 28 mm Promus DES  . Left heart catheterization with coronary angiogram N/A 02/18/2013    Procedure: LEFT HEART CATHETERIZATION WITH CORONARY ANGIOGRAM;  Surgeon: Martin Blanks, MD;  Location: [facility] CATH LAB;  Service: Cardiovascular;  Laterality: N/A;    Current Medications: Outpatient Prescriptions Prior to Visit  Medication Sig Dispense Refill  . atorvastatin (LIPITOR) 80 MG tablet Take 1 tablet (80 mg total) by mouth daily. 30 tablet 11  . clopidogrel (PLAVIX) 75 MG tablet TAKE 1 TABLET BY MOUTH EVERY DAY 90 tablet 1  . furosemide (LASIX) 40 MG tablet Take 1 tablet (40 mg total) by mouth daily. 30 tablet 11  . metoprolol tartrate (LOPRESSOR) 25 MG tablet TAKE 1 TABLET(25 MG) BY MOUTH DAILY 30 tablet 5  . potassium chloride SA (KLOR-CON M20) 20 MEQ tablet Take 1 tablet (20 mEq total) by mouth daily. 30 tablet 5   No facility-administered medications prior to visit.     Allergies:   Review of patient's allergies indicates no known allergies.   Social History   Social History  . Marital Status: Married    Spouse Name: N/A  . Number of Children: 1  . Years of Education: N/A   Occupational History  . truck driver    Social History Main Topics  . Smoking status: Former Smoker -- 3.00 packs/day for 50 years    Types: Cigarettes  Quit date: 03/04/2010  . Smokeless tobacco: Never Used  . Alcohol Use: Yes     Comment: beer on weekend  . Drug Use: No  . Sexual Activity: Not Asked   Other Topics Concern  . None   Social History Narrative     Family History:  The patient's    family history includes Bone cancer in his brother; Heart attack in his father; Heart disease in his father; Ovarian cancer in his mother. There is no history of Hypertension or Stroke.   ROS:   Please see the history of present illness.    Review of Systems  Constitution: Positive for malaise/fatigue.  HENT:  Negative.   Cardiovascular: Positive for dyspnea on exertion.  Respiratory: Negative.   Endocrine: Negative.   Hematologic/Lymphatic: Negative.   Musculoskeletal: Negative.   Gastrointestinal: Negative.   Genitourinary: Negative.   Neurological: Negative.    All other systems reviewed and are negative.   PHYSICAL EXAM:   VS:  BP 130/72 mmHg  Pulse 64  Ht 5\' 7"  (1.702 m)  Wt 174 lb (78.926 kg)  BMI 27.25 kg/m2   GEN: Well nourished, well developed, in no acute distress Neck: no JVD, carotid bruits, or masses Cardiac: RRR; S4,no murmurs, rubs, or ,no edema  Respiratory:  clear to auscultation bilaterally, normal work of breathing GI: soft, nontender, nondistended, + BS MS: no deformity or atrophy Skin: warm and dry, no rash Neuro:  Alert and Oriented x 3, Strength and sensation are intact Psych: euthymic mood, full affect  Wt Readings from Last 3 Encounters:  11/13/15 174 lb (78.926 kg)  02/28/15 174 lb (78.926 kg)  09/04/14 173 lb 3.2 oz (78.563 kg)      Studies/Labs Reviewed:   EKG:  EKG is  ordered today.  The ekg ordered today demonstratesNormal sinus rhythm with inferior Q waves, PVCs  Recent Labs: No results found for requested labs within last 365 days.   Lipid Panel    Component Value Date/Time   CHOL 202* 12/16/2011 0834   TRIG 91.0 12/16/2011 0834   HDL 48.00 12/16/2011 0834   CHOLHDL 4 12/16/2011 0834   VLDL 18.2 12/16/2011 0834   LDLDIRECT 131.1 12/16/2011 0834    Additional studies/ records that were reviewed today include:   Cardiac cath 02/18/13: Left main: 20% distal stenosis.   Left Anterior Descending Artery: Large caliber vessel that courses to the apex. There is a patent stent in the proximal vessel with no restenosis. The mid and distal vessel has mild plaque disease. There is a small caliber diagonal branch with mild ostial plaque.   Circumflex Artery: Moderate caliber vessel with moderate caliber first obtuse marginal branch. The first OM  branch has 40% stenosis. The AV groove Circumflex becomes small caliber and has a mid to distal 50% stenosis which is unchanged.   Right Coronary Artery: Moderate caliber dominant vessel with 70% distal stenosis leading into a very small caliber diffusely diseased PDA. The PLA is small in caliber with mild plaque disease.   Left Ventricular Angiogram: LVEF=50%.  Impression: 1. Stable double vessel CAD 2. Low normal LV systolic function 3. Dyspnea likely non-cardiac.      ASSESSMENT:    1. Coronary artery disease involving native coronary artery of native heart without angina pectoris   2. Chronic diastolic CHF (congestive heart failure) ([place])   3. Dyspnea   4. HLD (hyperlipidemia)      PLAN:  In order of problems listed above: CAD patient has history of DES  to the LAD in 2011. Follow-up cath in 2014 showed residual disease that was stable. He is now having similar symptoms of dyspnea on exertion to when he had the stent placed. No chest pain. Recommend Lexi scan Myoview and follow-up with Martin [name].  Chronic diastolic heart failure compensated. Patient recently had blood work by primary care so will not recheck.  Dyspnea on exertion similar to his symptoms prior to his stent recommend Lexi scan Myoview. 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  HLD patient had blood work by primary care that he will obtain for [place]    Medication Adjustments/Labs and Tests Ordered: Current medicines are reviewed at length with the patient today.  Concerns regarding medicines are outlined above.  Medication changes, Labs and Tests ordered today are listed in the Patient Instructions below. Patient Instructions  Medication Instructions:  Your physician recommends that you continue on your current medications as directed. Please refer to the Current Medication list given to you today.   Labwork: None ordered  Testing/Procedures: Your physician has requested that you have a lexiscan  myoview. For further information please visit [email or web address]. Please follow instruction sheet, as given.   Follow-Up: Your physician recommends that you schedule a follow-up appointment after test with Estella Husk, PA a day that Martin [name] is in the office   Any Other Special Instructions Will Be Listed Below (If Applicable).     If you need a refill on your cardiac medications before your next appointment, please call your pharmacy.       Sumner Boast, PA-C  11/13/2015 8:55 AM    [facility] [place], [place], [place]  [address] Phone: [phone]; Fax: [phone]

## 2015-11-14 ENCOUNTER — Telehealth (HOSPITAL_COMMUNITY): Payer: Self-pay | Admitting: *Deleted

## 2015-11-14 NOTE — Telephone Encounter (Signed)
Patient given detailed instructions per Myocardial Perfusion Study Information Sheet for the test on 11/16/15. Patient notified to arrive 15 minutes early and that it is imperative to arrive on time for appointment to keep from having the test rescheduled.  If you need to cancel or reschedule your appointment, please call the office within 24 hours of your appointment. Failure to do so may result in a cancellation of your appointment, and a $50 no show fee. Patient verbalized understanding. Hubbard Robinson, RN

## 2015-11-16 ENCOUNTER — Ambulatory Visit (HOSPITAL_COMMUNITY): Payer: PPO | Attending: Cardiology

## 2015-11-16 DIAGNOSIS — R5383 Other fatigue: Secondary | ICD-10-CM | POA: Insufficient documentation

## 2015-11-16 DIAGNOSIS — R9439 Abnormal result of other cardiovascular function study: Secondary | ICD-10-CM | POA: Insufficient documentation

## 2015-11-16 DIAGNOSIS — I2510 Atherosclerotic heart disease of native coronary artery without angina pectoris: Secondary | ICD-10-CM

## 2015-11-16 LAB — MYOCARDIAL PERFUSION IMAGING
CHL CUP NUCLEAR SRS: 11
CHL CUP RESTING HR STRESS: 68 {beats}/min
LV sys vol: 52 mL
LVDIAVOL: 101 mL (ref 62–150)
Peak HR: 86 {beats}/min
RATE: 0.27
SDS: 0
SSS: 11
TID: 0.99

## 2015-11-16 MED ORDER — REGADENOSON 0.4 MG/5ML IV SOLN
0.4000 mg | Freq: Once | INTRAVENOUS | Status: AC
  Administered 2015-11-16: 0.4 mg via INTRAVENOUS

## 2015-11-16 MED ORDER — TECHNETIUM TC 99M TETROFOSMIN IV KIT
30.9000 | PACK | Freq: Once | INTRAVENOUS | Status: AC | PRN
  Administered 2015-11-16: 30.9 via INTRAVENOUS
  Filled 2015-11-16: qty 31

## 2015-11-16 MED ORDER — TECHNETIUM TC 99M TETROFOSMIN IV KIT
10.7000 | PACK | Freq: Once | INTRAVENOUS | Status: AC | PRN
Start: 2015-11-16 — End: 2015-11-16
  Administered 2015-11-16: 11 via INTRAVENOUS
  Filled 2015-11-16: qty 11

## 2015-11-27 ENCOUNTER — Ambulatory Visit (INDEPENDENT_AMBULATORY_CARE_PROVIDER_SITE_OTHER): Payer: PPO | Admitting: Physician Assistant

## 2015-11-27 ENCOUNTER — Encounter: Payer: Self-pay | Admitting: Physician Assistant

## 2015-11-27 VITALS — BP 142/92 | HR 53 | Ht 67.0 in | Wt 171.0 lb

## 2015-11-27 DIAGNOSIS — R0600 Dyspnea, unspecified: Secondary | ICD-10-CM

## 2015-11-27 DIAGNOSIS — E785 Hyperlipidemia, unspecified: Secondary | ICD-10-CM | POA: Diagnosis not present

## 2015-11-27 DIAGNOSIS — I712 Thoracic aortic aneurysm, without rupture, unspecified: Secondary | ICD-10-CM

## 2015-11-27 DIAGNOSIS — I2510 Atherosclerotic heart disease of native coronary artery without angina pectoris: Secondary | ICD-10-CM | POA: Diagnosis not present

## 2015-11-27 LAB — HEPATIC FUNCTION PANEL
ALBUMIN: 4.1 g/dL (ref 3.6–5.1)
ALK PHOS: 72 U/L (ref 40–115)
ALT: 19 U/L (ref 9–46)
AST: 19 U/L (ref 10–35)
BILIRUBIN TOTAL: 1.2 mg/dL (ref 0.2–1.2)
Bilirubin, Direct: 0.2 mg/dL (ref <=0.2)
Indirect Bilirubin: 1 mg/dL (ref 0.2–1.2)
Total Protein: 6.1 g/dL (ref 6.1–8.1)

## 2015-11-27 LAB — LIPID PANEL
Cholesterol: 174 mg/dL (ref 125–200)
HDL: 71 mg/dL (ref >=40)
LDL CALC: 89 mg/dL (ref <130)
Total CHOL/HDL Ratio: 2.5 Ratio (ref <=5.0)
Triglycerides: 68 mg/dL (ref <150)
VLDL: 14 mg/dL (ref <30)

## 2015-11-27 LAB — BASIC METABOLIC PANEL
BUN: 20 mg/dL (ref 7–25)
CALCIUM: 8.5 mg/dL — AB (ref 8.6–10.3)
CHLORIDE: 103 mmol/L (ref 98–110)
CO2: 26 mmol/L (ref 20–31)
Creat: 0.94 mg/dL (ref 0.70–1.18)
GLUCOSE: 92 mg/dL (ref 65–99)
Potassium: 4.5 mmol/L (ref 3.5–5.3)
SODIUM: 137 mmol/L (ref 135–146)

## 2015-11-27 NOTE — Patient Instructions (Addendum)
Medication Instructions:  Your physician recommends that you continue on your current medications as directed. Please refer to the Current Medication list given to you today.   Labwork: Lipid,Lft,Bmet today  Testing/Procedures: Non-Cardiac CT Angiography (CTA), is a special type of CT scan that uses a computer to produce multi-dimensional views of major blood vessels throughout the body. In CT angiography, a contrast material is injected through an IV to help visualize the blood vessels   Follow-Up: Your physician wants you to follow-up in: 1 year with Dr.Mcalhany You will receive a reminder letter in the mail two months in advance. If you don't receive a letter, please call our office to schedule the follow-up appointment.   Any Other Special Instructions Will Be Listed Below (If Applicable).     If you need a refill on your cardiac medications before your next appointment, please call your pharmacy.

## 2015-11-27 NOTE — Progress Notes (Signed)
Cardiology Office Note    Date:  11/27/2015   ID:  Aashrith, Wilhelm 1941-04-07, MRN [id number]  PCP:  Myrlene Broker, MD  Cardiologist: Dr. Angelena Form    History of Present Illness:  Martin [name] is a 75 y.o. male with h/o CAD, HLD, dilated aortic root, Chronic diastolic heart failure, BPH, former tobacco abuse here today for cardiac follow up. He has been followed in the past by Dr. Fletcher Anon. Stress test September 2011 with ischemia. Cath September 2011 with showed a 20% left main disease, 80% ostial LAD, and diffuse 70-80% proximal left anterior descending coronary artery disease, distal right coronary artery with 60% stenosis leading into a small PDA which was diffusely diseased and occluded distally. Ejection fraction was 55%. He underwent PCI with placement of a drug-eluting stent in the ostial and proximal left anterior descending coronary artery with a 3.0 x 28 mm Promus drug-eluting stent. Cardiac cath September 2014 with stable CAD. Seen here in our office September 2015 with c/o LE edema and cellulitis He was started on Lasix and treated with antibiotics. Last seen by Dr.McAlhany 02/2015 and had slight worsening dyspnea but no chest pain. He wasn't taking his Lasix so it was restarted at 40 mg every other day.  Retired 2 months ago. Recently complains of giving out when walking to the mailbox or doing any little activity. Has to stop to rest. Gets short of breath but no chest tightness. Same symptoms as prior stent. 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 Says he can't walk on a treadmill.  I saw the patient 11/14/15 and ordered a nuclear stress test. Ejection fraction was 49% he had nonreversible defect consistent with diaphragmatic attenuation, no ischemia noted, small defect of mild severity and the apical lateral location, nonreversible frequent PVCs noted during infusion.  Patient comes in today saying he feels about the same. He gets out of breath if he does  heavy exertion his yard. He denies any chest pain, palpitations, dizziness, or presyncope. He does not feel his heart skipping. He would like to start driving his truck again. Lab work reviewed from November and potassium was normal.    Past Medical History  Diagnosis Date  . BPH (benign prostatic hyperplasia)   . Dilated aortic root (HCC)     Ascending aorta (noted during cath)  . CAD (coronary artery disease)   . HLD (hyperlipidemia)     Past Surgical History  Procedure Laterality Date  . Cardiac catheterization  02/2010    20% left main, 80% ostial/proximal LAD, 60% distal RCA leading into a small PDA which is occluded distally  . Coronary angioplasty  02/2010    LAD: 3.0 X 28 mm Promus DES  . Left heart catheterization with coronary angiogram N/A 02/18/2013    Procedure: LEFT HEART CATHETERIZATION WITH CORONARY ANGIOGRAM;  Surgeon: [name], MD;  Location: [facility] CATH LAB;  Service: Cardiovascular;  Laterality: N/A;    Current Medications: Outpatient Prescriptions Prior to Visit  Medication Sig Dispense Refill  . atorvastatin (LIPITOR) 80 MG tablet Take 1 tablet (80 mg total) by mouth daily. 30 tablet 11  . clopidogrel (PLAVIX) 75 MG tablet TAKE 1 TABLET BY MOUTH EVERY DAY 90 tablet 1  . furosemide (LASIX) 40 MG tablet Take 1 tablet (40 mg total) by mouth daily. 30 tablet 11  . metoprolol tartrate (LOPRESSOR) 25 MG tablet TAKE 1 TABLET(25 MG) BY MOUTH DAILY 30 tablet 5  . potassium chloride SA (KLOR-CON M20) 20  MEQ tablet Take 1 tablet (20 mEq total) by mouth daily. 30 tablet 5  . azithromycin (ZITHROMAX) 250 MG tablet Take 250 mg by mouth daily.     . fluticasone (FLONASE) 50 MCG/ACT nasal spray Place 1 spray into both nostrils daily.     No facility-administered medications prior to visit.     Allergies:   Review of patient's allergies indicates no known allergies.   Social History   Social History  . Marital Status: Married    Spouse Name: N/A  . Number of  Children: 1  . Years of Education: N/A   Occupational History  . truck driver    Social History Main Topics  . Smoking status: Former Smoker -- 3.00 packs/day for 50 years    Types: Cigarettes    Quit date: 03/04/2010  . Smokeless tobacco: Never Used  . Alcohol Use: Yes     Comment: beer on weekend  . Drug Use: No  . Sexual Activity: Not Asked   Other Topics Concern  . None   Social History Narrative     Family History:  The patient's    family history includes Bone cancer in his brother; Heart attack in his father; Heart disease in his father; Ovarian cancer in his mother. There is no history of Hypertension or Stroke.   ROS:   Please see the history of present illness.    Review of Systems  Constitution: Positive for malaise/fatigue.  HENT: Negative.   Cardiovascular: Positive for dyspnea on exertion.  Respiratory: Negative.   Endocrine: Negative.   Hematologic/Lymphatic: Negative.   Musculoskeletal: Negative.   Gastrointestinal: Negative.   Genitourinary: Negative.   Neurological: Negative.    All other systems reviewed and are negative.   PHYSICAL EXAM:   VS:  BP 142/92 mmHg  Pulse 53  Ht 5\' 7"  (1.702 m)  Wt 171 lb (77.565 kg)  BMI 26.78 kg/m2  SpO2 98%   GEN: Well nourished, well developed, in no acute distress Neck: no JVD, carotid bruits, or masses Cardiac: RRR; S4,no murmurs, rubs, or ,no edema  Respiratory:  clear to auscultation bilaterally, normal work of breathing GI: soft, nontender, nondistended, + BS MS: no deformity or atrophy Skin: warm and dry, no rash Neuro:  Alert and Oriented x 3, Strength and sensation are intact Psych: euthymic mood, full affect  Wt Readings from Last 3 Encounters:  11/27/15 171 lb (77.565 kg)  11/13/15 174 lb (78.926 kg)  02/28/15 174 lb (78.926 kg)      Studies/Labs Reviewed:   EKG:  EKG is  ordered today.  The ekg ordered today demonstratesNormal sinus rhythm with inferior Q waves, PVCs  Recent Labs: No  results found for requested labs within last 365 days.   Lipid Panel    Component Value Date/Time   CHOL 202* 12/16/2011 0834   TRIG 91.0 12/16/2011 0834   HDL 48.00 12/16/2011 0834   CHOLHDL 4 12/16/2011 0834   VLDL 18.2 12/16/2011 0834   LDLDIRECT 131.1 12/16/2011 0834    Additional studies/ records that were reviewed today include:   Cardiac cath 02/18/13: Left main: 20% distal stenosis.   Left Anterior Descending Artery: Large caliber vessel that courses to the apex. There is a patent stent in the proximal vessel with no restenosis. The mid and distal vessel has mild plaque disease. There is a small caliber diagonal branch with mild ostial plaque.   Circumflex Artery: Moderate caliber vessel with moderate caliber first obtuse marginal branch. The first OM branch  has 40% stenosis. The AV groove Circumflex becomes small caliber and has a mid to distal 50% stenosis which is unchanged.   Right Coronary Artery: Moderate caliber dominant vessel with 70% distal stenosis leading into a very small caliber diffusely diseased PDA. The PLA is small in caliber with mild plaque disease.   Left Ventricular Angiogram: LVEF=50%.  Impression: 1. Stable double vessel CAD 2. Low normal LV systolic function 3. Dyspnea likely non-cardiac.    Study Highlights      Nuclear stress EF: 49%.  There was no ST segment deviation noted during stress.  The defect is non-reversible. This is consistent with diaphragmatic attenuation. No ischemia noted. The defect is non-reversible. This is consistent with diaphragmatic attenuation. No ischemia noted.  There is a small defect of mild severity present in the apical lateral location. The defect is non-reversible. The defect is non-reversible. This is consistent with diaphragmatic attenuation. No ischemia noted.  Frequent PVCs were noted during the infusion.  This is a low risk study.  The left ventricular ejection fraction is mildly decreased (45-54%).   CT  angio of his chest 2012 IMPRESSION:   1.  Ascending thoracic aortic aneurysm measuring up to 4.2 cm in diameter. 2.  Left anterior descending coronary artery stent. 3.  Mild scarring or atelectasis in the right middle lobe and right lower lobe.   Original Report Authenticated By: Carron Curie, M.D.   ASSESSMENT:    1. Hyperlipidemia   2. Dyspnea   3. Coronary artery disease involving native coronary artery of native heart without angina pectoris   4. Thoracic aortic aneurysm without rupture (HCC)      PLAN:  In order of problems listed above:  Hyperlipidemia we'll check fasting lipid panel and LFTs today. He is on Lipitor.  Dyspnea on exertion Lexi scan Myoview showed no evidence of ischemia, diastolic heart failure is compensated. Patient did have frequent PVCs during stress test. He has no symptoms related to this. If he has recurrent symptoms may want to place a Holter monitor.will be okay to drive his truck after CT scan results are back. Follow-up with Dr.McAlhany.  CAD patient has history of DES to the LAD in 2011. Follow-up cath in 2014 showed residual disease that was stable. He is now having similar symptoms of dyspnea on exertion to when he had the stent placed. No chest pain. Lexi scan Myoview negative for ischemia.  History of thoracic aortic aneurysm last CT scan 2012 we'll reorder.    Medication Adjustments/Labs and Tests Ordered: Current medicines are reviewed at length with the patient today.  Concerns regarding medicines are outlined above.  Medication changes, Labs and Tests ordered today are listed in the Patient Instructions below. Patient Instructions  Medication Instructions:  Your physician recommends that you continue on your current medications as directed. Please refer to the Current Medication list given to you today.   Labwork: Lipid,Lft,Bmet today  Testing/Procedures: Non-Cardiac CT Angiography (CTA), is a special type of CT scan that  uses a computer to produce multi-dimensional views of major blood vessels throughout the body. In CT angiography, a contrast material is injected through an IV to help visualize the blood vessels   Follow-Up: Your physician wants you to follow-up in: 1 year with Dr.Mcalhany You will receive a reminder letter in the mail two months in advance. If you don't receive a letter, please call our office to schedule the follow-up appointment.   Any Other Special Instructions Will Be Listed Below (If Applicable).  If you need a refill on your cardiac medications before your next appointment, please call your pharmacy.       Sumner Boast, PA-C  11/27/2015 8:27 AM    [facility] [place], [place], [place]  [address] Phone: [phone]; Fax: [phone]

## 2015-11-30 ENCOUNTER — Ambulatory Visit (INDEPENDENT_AMBULATORY_CARE_PROVIDER_SITE_OTHER)
Admission: RE | Admit: 2015-11-30 | Discharge: 2015-11-30 | Disposition: A | Discharge status: Home / Self Care | Payer: PPO | Source: Ambulatory Visit | Attending: Physician Assistant | Admitting: Physician Assistant

## 2015-11-30 DIAGNOSIS — I712 Thoracic aortic aneurysm, without rupture, unspecified: Secondary | ICD-10-CM

## 2015-11-30 MED ORDER — IOPAMIDOL (ISOVUE-370) INJECTION 76%
100.0000 mL | Freq: Once | INTRAVENOUS | Status: AC | PRN
  Administered 2015-11-30: 100 mL via INTRAVENOUS

## 2015-12-03 ENCOUNTER — Telehealth: Payer: Self-pay | Admitting: Cardiovascular Disease

## 2015-12-03 NOTE — Telephone Encounter (Signed)
Copied from CT Chest report 11/30/15:  MPRESSION: 1. Ascending aortic aneurysm measures 4.4 cm. Increased from 4.2 cm previously. Recommend annual imaging followup by CTA or MRA. This recommendation follows 2010 ACCF/AHA/AATS/ACR/ASA/SCA/SCAI/SIR/STS/SVM Guidelines for the Diagnosis and Management of Patients with Thoracic Aortic Disease. Circulation. 2010; 121: [id number] 2. LAD coronary artery stent.   Pt given these impressions and advised report has not been reviewed by Selinda Eon. Pt advised once report has been reviewed we will call him with final results and recommendations

## 2015-12-03 NOTE — Telephone Encounter (Signed)
Pt calling for results of CT Chest done 11/30/15.

## 2015-12-03 NOTE — Telephone Encounter (Signed)
New Message  Pt requested to speak w/ RN concerning CT results. Please call back and discuss.

## 2015-12-05 ENCOUNTER — Other Ambulatory Visit: Payer: Self-pay | Admitting: Cardiovascular Disease

## 2015-12-05 NOTE — Telephone Encounter (Signed)
Reviewed CT and recommend repeat in 1 yr

## 2015-12-06 ENCOUNTER — Telehealth: Payer: Self-pay | Admitting: Cardiovascular Disease

## 2015-12-06 NOTE — Telephone Encounter (Signed)
F/u Message ° °Pt wife returning RN call. Please call back to discuss  °

## 2015-12-06 NOTE — Telephone Encounter (Signed)
I spoke with pt's wife and reviewed results of CT and recommendations with her.

## 2015-12-06 NOTE — Telephone Encounter (Signed)
I spoke with pt's wife and reviewed CT results/recommendations and lab results/recommendations with her.

## 2016-04-19 ENCOUNTER — Other Ambulatory Visit: Payer: Self-pay | Admitting: Cardiovascular Disease

## 2016-05-02 DIAGNOSIS — H353130 Nonexudative age-related macular degeneration, bilateral, stage unspecified: Secondary | ICD-10-CM | POA: Diagnosis not present

## 2016-05-02 DIAGNOSIS — Z961 Presence of intraocular lens: Secondary | ICD-10-CM | POA: Diagnosis not present

## 2016-05-02 DIAGNOSIS — H11153 Pinguecula, bilateral: Secondary | ICD-10-CM | POA: Diagnosis not present

## 2016-05-03 ENCOUNTER — Other Ambulatory Visit: Payer: Self-pay | Admitting: Cardiovascular Disease

## 2016-05-09 DIAGNOSIS — K591 Functional diarrhea: Secondary | ICD-10-CM | POA: Diagnosis not present

## 2016-07-12 DIAGNOSIS — S0993XA Unspecified injury of face, initial encounter: Secondary | ICD-10-CM | POA: Diagnosis not present

## 2016-07-12 DIAGNOSIS — S2231XA Fracture of one rib, right side, initial encounter for closed fracture: Secondary | ICD-10-CM | POA: Diagnosis not present

## 2016-07-14 DIAGNOSIS — K222 Esophageal obstruction: Secondary | ICD-10-CM | POA: Diagnosis not present

## 2016-07-14 DIAGNOSIS — R1310 Dysphagia, unspecified: Secondary | ICD-10-CM | POA: Diagnosis not present

## 2016-07-17 DIAGNOSIS — R079 Chest pain, unspecified: Secondary | ICD-10-CM | POA: Diagnosis not present

## 2016-07-17 DIAGNOSIS — J988 Other specified respiratory disorders: Secondary | ICD-10-CM | POA: Diagnosis not present

## 2016-07-17 DIAGNOSIS — R0602 Shortness of breath: Secondary | ICD-10-CM | POA: Diagnosis not present

## 2016-07-17 DIAGNOSIS — R0781 Pleurodynia: Secondary | ICD-10-CM | POA: Diagnosis not present

## 2016-07-17 DIAGNOSIS — S2231XS Fracture of one rib, right side, sequela: Secondary | ICD-10-CM | POA: Diagnosis not present

## 2016-07-17 DIAGNOSIS — Z1389 Encounter for screening for other disorder: Secondary | ICD-10-CM | POA: Diagnosis not present

## 2016-07-17 DIAGNOSIS — Z9181 History of falling: Secondary | ICD-10-CM | POA: Diagnosis not present

## 2016-07-25 DIAGNOSIS — J0100 Acute maxillary sinusitis, unspecified: Secondary | ICD-10-CM | POA: Diagnosis not present

## 2016-07-25 DIAGNOSIS — R071 Chest pain on breathing: Secondary | ICD-10-CM | POA: Diagnosis not present

## 2016-07-25 DIAGNOSIS — J209 Acute bronchitis, unspecified: Secondary | ICD-10-CM | POA: Diagnosis not present

## 2016-07-29 DIAGNOSIS — S20211S Contusion of right front wall of thorax, sequela: Secondary | ICD-10-CM | POA: Diagnosis not present

## 2016-07-30 DIAGNOSIS — K219 Gastro-esophageal reflux disease without esophagitis: Secondary | ICD-10-CM | POA: Diagnosis not present

## 2016-07-30 DIAGNOSIS — K635 Polyp of colon: Secondary | ICD-10-CM | POA: Diagnosis not present

## 2016-07-30 DIAGNOSIS — I10 Essential (primary) hypertension: Secondary | ICD-10-CM | POA: Diagnosis not present

## 2016-07-30 DIAGNOSIS — K2970 Gastritis, unspecified, without bleeding: Secondary | ICD-10-CM | POA: Diagnosis not present

## 2016-07-30 DIAGNOSIS — K222 Esophageal obstruction: Secondary | ICD-10-CM | POA: Diagnosis not present

## 2016-07-30 DIAGNOSIS — Z955 Presence of coronary angioplasty implant and graft: Secondary | ICD-10-CM | POA: Diagnosis not present

## 2016-07-30 DIAGNOSIS — D120 Benign neoplasm of cecum: Secondary | ICD-10-CM | POA: Diagnosis not present

## 2016-07-30 DIAGNOSIS — K5730 Diverticulosis of large intestine without perforation or abscess without bleeding: Secondary | ICD-10-CM | POA: Diagnosis not present

## 2016-07-30 DIAGNOSIS — Z7982 Long term (current) use of aspirin: Secondary | ICD-10-CM | POA: Diagnosis not present

## 2016-07-30 DIAGNOSIS — Z86010 Personal history of colonic polyps: Secondary | ICD-10-CM | POA: Diagnosis not present

## 2016-07-30 DIAGNOSIS — Z7902 Long term (current) use of antithrombotics/antiplatelets: Secondary | ICD-10-CM | POA: Diagnosis not present

## 2016-07-30 DIAGNOSIS — K644 Residual hemorrhoidal skin tags: Secondary | ICD-10-CM | POA: Diagnosis not present

## 2016-07-30 DIAGNOSIS — I2510 Atherosclerotic heart disease of native coronary artery without angina pectoris: Secondary | ICD-10-CM | POA: Diagnosis not present

## 2016-07-30 DIAGNOSIS — K648 Other hemorrhoids: Secondary | ICD-10-CM | POA: Diagnosis not present

## 2016-07-30 DIAGNOSIS — R1310 Dysphagia, unspecified: Secondary | ICD-10-CM | POA: Diagnosis not present

## 2016-07-30 DIAGNOSIS — Z1211 Encounter for screening for malignant neoplasm of colon: Secondary | ICD-10-CM | POA: Diagnosis not present

## 2016-07-30 DIAGNOSIS — Z79899 Other long term (current) drug therapy: Secondary | ICD-10-CM | POA: Diagnosis not present

## 2016-09-10 DIAGNOSIS — H903 Sensorineural hearing loss, bilateral: Secondary | ICD-10-CM | POA: Diagnosis not present

## 2016-09-10 DIAGNOSIS — H9313 Tinnitus, bilateral: Secondary | ICD-10-CM | POA: Diagnosis not present

## 2016-09-25 DIAGNOSIS — R509 Fever, unspecified: Secondary | ICD-10-CM | POA: Diagnosis not present

## 2016-09-25 DIAGNOSIS — J209 Acute bronchitis, unspecified: Secondary | ICD-10-CM | POA: Diagnosis not present

## 2016-09-25 DIAGNOSIS — S20219A Contusion of unspecified front wall of thorax, initial encounter: Secondary | ICD-10-CM | POA: Diagnosis not present

## 2016-09-29 DIAGNOSIS — R0781 Pleurodynia: Secondary | ICD-10-CM | POA: Diagnosis not present

## 2016-10-02 DIAGNOSIS — R079 Chest pain, unspecified: Secondary | ICD-10-CM | POA: Diagnosis not present

## 2016-10-02 DIAGNOSIS — S299XXA Unspecified injury of thorax, initial encounter: Secondary | ICD-10-CM | POA: Diagnosis not present

## 2016-10-02 DIAGNOSIS — R0781 Pleurodynia: Secondary | ICD-10-CM | POA: Diagnosis not present

## 2016-12-07 NOTE — Progress Notes (Signed)
Chief Complaint  Patient presents with  . Leg Swelling    History of Present Illness: 76 yo white male with history of CAD, HLD, thoracic aortic aneurysmt, chronic diastolic CHF, PVCs, former tobacco abuse here today for cardiac follow up. Cardiac cath September 2011 with severe disease in the proximal LAD, moderate stenosis in the distal RCA leading into a small PDA. He underwent PCI with placement of a drug-eluting stent in the ostial and proximal LAD with a 3.0 x 28 mm Promus drug-eluting stent. Cardiac cath September 2014 with stable CAD. Nuclear stress test June 2017 with no ischemia. He has been treated with Lasix for LE edema felt to be due to diastolic CHF but he has not been taking his lasix. He is known to have a thoracic aortic aneurysm. Last CTA June 2017 with 4.4 cm ascending aortic aneurysm.   He is here today for follow up. The patient denies any chest pain, dyspnea, palpitations, lower extremity edema, orthopnea, PND, dizziness, near syncope or syncope. He is a long distance Administrator.   Primary Care Physician: Myrlene Broker, MD    Past Medical History:  Diagnosis Date  . BPH (benign prostatic hyperplasia)   . CAD (coronary artery disease)   . Dilated aortic root (HCC)    Ascending aorta (noted during cath)  . HLD (hyperlipidemia)     Past Surgical History:  Procedure Laterality Date  . CARDIAC CATHETERIZATION  02/2010   20% left main, 80% ostial/proximal LAD, 60% distal RCA leading into a small PDA which is occluded distally  . CORONARY ANGIOPLASTY  02/2010   LAD: 3.0 X 28 mm Promus DES  . LEFT HEART CATHETERIZATION WITH CORONARY ANGIOGRAM N/A 02/18/2013   Procedure: LEFT HEART CATHETERIZATION WITH CORONARY ANGIOGRAM;  Surgeon: [name], MD;  Location: [facility] CATH LAB;  Service: Cardiovascular;  Laterality: N/A;    Current Outpatient Prescriptions  Medication Sig Dispense Refill  . atorvastatin (LIPITOR) 80 MG tablet TAKE 1 TABLET BY MOUTH EVERY  DAY 30 tablet 11  . clopidogrel (PLAVIX) 75 MG tablet TAKE 1 TABLET BY MOUTH EVERY DAY 90 tablet 3  . furosemide (LASIX) 40 MG tablet Take 1 tablet (40 mg total) by mouth daily. 30 tablet 11  . metoprolol tartrate (LOPRESSOR) 25 MG tablet TAKE 1 TABLET(25 MG) BY MOUTH DAILY 30 tablet 5  . potassium chloride SA (KLOR-CON M20) 20 MEQ tablet Take 1 tablet (20 mEq total) by mouth daily. 30 tablet 5   No current facility-administered medications for this visit.     No Known Allergies  Social History   Social History  . Marital status: Married    Spouse name: N/A  . Number of children: 1  . Years of education: N/A   Occupational History  . truck driver    Social History Main Topics  . Smoking status: Former Smoker    Packs/day: 3.00    Years: 50.00    Types: Cigarettes    Quit date: 03/04/2010  . Smokeless tobacco: Never Used  . Alcohol use Yes     Comment: beer on weekend  . Drug use: No  . Sexual activity: Not on file   Other Topics Concern  . Not on file   Social History Narrative  . No narrative on file    Family History  Problem Relation Age of Onset  . Ovarian cancer Mother   . Heart disease Father   . Bone cancer Brother   . Heart attack Father   .  Hypertension Neg Hx   . Stroke Neg Hx     Review of Systems:  As stated in the HPI and otherwise negative.   BP 132/70   Pulse 62   Ht 5' 7"  (1.702 m)   Wt 163 lb (73.9 kg)   BMI 25.53 kg/m   Physical Examination: General: Well developed, well nourished, NAD  HEENT: OP clear, mucus membranes moist  SKIN: warm, dry. No rashes. Neuro: No focal deficits  Musculoskeletal: Muscle strength 5/5 all ext  Psychiatric: Mood and affect normal  Neck: No JVD, no carotid bruits, no thyromegaly, no lymphadenopathy.  Lungs:Clear bilaterally, no wheezes, rhonci, crackles Cardiovascular: Regular rate and rhythm. No murmurs, gallops or rubs. Abdomen:Soft. Bowel sounds present. Non-tender.  Extremities: No lower extremity  edema. Pulses are 2 + in the bilateral DP/PT.  Cardiac cath 02/18/13: Left main: 20% distal stenosis.  Left Anterior Descending Artery: Large caliber vessel that courses to the apex. There is a patent stent in the proximal vessel with no restenosis. The mid and distal vessel has mild plaque disease. There is a small caliber diagonal branch with mild ostial plaque.  Circumflex Artery: Moderate caliber vessel with moderate caliber first obtuse marginal branch. The first OM branch has 40% stenosis. The AV groove Circumflex becomes small caliber and has a mid to distal 50% stenosis which is unchanged.  Right Coronary Artery: Moderate caliber dominant vessel with 70% distal stenosis leading into a very small caliber diffusely diseased PDA. The PLA is small in caliber with mild plaque disease.  Left Ventricular Angiogram: LVEF=50%.  Impression: 1. Stable double vessel CAD 2. Low normal LV systolic function 3. Dyspnea likely non-cardiac.   EKG:  EKG is ordered today. The ekg ordered today demonstrates Sinus rhythm. PACs.   Recent Labs: No results found for requested labs within last 8760 hours.   Lipid Panel    Component Value Date/Time   CHOL 174 11/27/2015 0811   TRIG 68 11/27/2015 0811   HDL 71 11/27/2015 0811   CHOLHDL 2.5 11/27/2015 0811   VLDL 14 11/27/2015 0811   LDLCALC 89 11/27/2015 0811   LDLDIRECT 131.1 12/16/2011 0834     Wt Readings from Last 3 Encounters:  12/08/16 163 lb (73.9 kg)  11/27/15 171 lb (77.6 kg)  11/13/15 174 lb (78.9 kg)     Other studies Reviewed: Additional studies/ records that were reviewed today include: . Review of the above records demonstrates:   Assessment and Plan:   1. CAD without angina: He is having no chest pain. Stress test in June 2017 with no ischemia. Last cath in September 2014 with patent LAD stent and stable RCA/Circumflex disease. Continue Plavix, beta blocker and statin.    2. Hyperlipidemia: Continue statin. Check lipids and  LFTs today.  3. PVC: Continue beta blocker.   4. Chronic diastolic CHF: Volume status is stable. Continue Lasix as needed.   5. Thoracic aortic aneurysm: 4.4 cm ascending aneurysm by chest CTA June 2017. Repeat Chest CTA now. BMET today.   Current medicines are reviewed at length with the patient today.  The patient does not have concerns regarding medicines.  The following changes have been made:  Lasix restarted.   Labs/ tests ordered today include:   Orders Placed This Encounter  Procedures  . CT ANGIO CHEST AORTA W &/OR WO CONTRAST  . Comp Met (CMET)  . Lipid Profile  . EKG 12-Lead    Disposition:   FU with me in 12 months  Signed, Lauree Chandler,  MD 12/08/2016 11:54 AM    [place], [place], [place]  [address] Phone: [phone]; Fax: [phone]

## 2016-12-08 ENCOUNTER — Ambulatory Visit (INDEPENDENT_AMBULATORY_CARE_PROVIDER_SITE_OTHER): Payer: PPO | Admitting: Cardiovascular Disease

## 2016-12-08 VITALS — BP 132/70 | HR 62 | Ht 67.0 in | Wt 163.0 lb

## 2016-12-08 DIAGNOSIS — I2510 Atherosclerotic heart disease of native coronary artery without angina pectoris: Secondary | ICD-10-CM | POA: Diagnosis not present

## 2016-12-08 DIAGNOSIS — I493 Ventricular premature depolarization: Secondary | ICD-10-CM

## 2016-12-08 DIAGNOSIS — I712 Thoracic aortic aneurysm, without rupture, unspecified: Secondary | ICD-10-CM

## 2016-12-08 DIAGNOSIS — E7800 Pure hypercholesterolemia, unspecified: Secondary | ICD-10-CM | POA: Diagnosis not present

## 2016-12-08 DIAGNOSIS — I5032 Chronic diastolic (congestive) heart failure: Secondary | ICD-10-CM | POA: Diagnosis not present

## 2016-12-08 LAB — COMPREHENSIVE METABOLIC PANEL
ALBUMIN: 4.5 g/dL (ref 3.5–4.8)
ALT: 12 IU/L (ref 0–44)
AST: 21 IU/L (ref 0–40)
Albumin/Globulin Ratio: 2.1 (ref 1.2–2.2)
Alkaline Phosphatase: 100 IU/L (ref 39–117)
BILIRUBIN TOTAL: 1.1 mg/dL (ref 0.0–1.2)
BUN / CREAT RATIO: 17 (ref 10–24)
BUN: 16 mg/dL (ref 8–27)
CALCIUM: 9.2 mg/dL (ref 8.6–10.2)
CHLORIDE: 102 mmol/L (ref 96–106)
CO2: 23 mmol/L (ref 20–29)
Creatinine, Ser: 0.92 mg/dL (ref 0.76–1.27)
GFR, EST AFRICAN AMERICAN: 92 mL/min/{1.73_m2} (ref >59)
GFR, EST NON AFRICAN AMERICAN: 80 mL/min/{1.73_m2} (ref >59)
GLUCOSE: 92 mg/dL (ref 65–99)
Globulin, Total: 2.1 g/dL (ref 1.5–4.5)
Potassium: 4.2 mmol/L (ref 3.5–5.2)
Sodium: 140 mmol/L (ref 134–144)
TOTAL PROTEIN: 6.6 g/dL (ref 6.0–8.5)

## 2016-12-08 LAB — LIPID PANEL
CHOL/HDL RATIO: 3.7 ratio (ref 0.0–5.0)
Cholesterol, Total: 179 mg/dL (ref 100–199)
HDL: 49 mg/dL (ref >39)
LDL CALC: 113 mg/dL — AB (ref 0–99)
Triglycerides: 83 mg/dL (ref 0–149)
VLDL CHOLESTEROL CAL: 17 mg/dL (ref 5–40)

## 2016-12-08 NOTE — Patient Instructions (Signed)
Medication Instructions:  Your physician recommends that you continue on your current medications as directed. Please refer to the Current Medication list given to you today.   Labwork: Lab work to be done today--CMET and Lipid profile  Testing/Procedures: Non-Cardiac CT Angiography (CTA), is a special type of CT scan that uses a computer to produce multi-dimensional views of major blood vessels throughout the body. In CT angiography, a contrast material is injected through an IV to help visualize the blood vessels   Follow-Up: Your physician recommends that you schedule a follow-up appointment in: 12 months. Please call our office in about 9 months to schedule this appointment.    Any Other Special Instructions Will Be Listed Below (If Applicable).     If you need a refill on your cardiac medications before your next appointment, please call your pharmacy.

## 2016-12-09 ENCOUNTER — Ambulatory Visit (INDEPENDENT_AMBULATORY_CARE_PROVIDER_SITE_OTHER)
Admission: RE | Admit: 2016-12-09 | Discharge: 2016-12-09 | Disposition: A | Discharge status: Home / Self Care | Payer: PPO | Source: Ambulatory Visit | Attending: Cardiovascular Disease | Admitting: Cardiovascular Disease

## 2016-12-09 ENCOUNTER — Telehealth: Payer: Self-pay | Admitting: *Deleted

## 2016-12-09 DIAGNOSIS — I712 Thoracic aortic aneurysm, without rupture, unspecified: Secondary | ICD-10-CM

## 2016-12-09 DIAGNOSIS — J439 Emphysema, unspecified: Secondary | ICD-10-CM | POA: Diagnosis not present

## 2016-12-09 MED ORDER — IOPAMIDOL (ISOVUE-370) INJECTION 76%
100.0000 mL | Freq: Once | INTRAVENOUS | Status: AC | PRN
  Administered 2016-12-09: 100 mL via INTRAVENOUS

## 2016-12-09 NOTE — Telephone Encounter (Signed)
-----   Message from [name], MD sent at 12/09/2016  1:13 PM EDT ----- LFTs ok. Renal function ok. LDL has gone up. Can we make sure he is taking his Lipitor daily and ask him to focus on dietary changes? Repeat lipids and LFTs in 6 months. Martin [name]

## 2016-12-09 NOTE — Telephone Encounter (Signed)
Patient informed and confirmed that he is taking his lipitor 80 mg daily but admits to not following a low fat, low cholesterol diet.

## 2016-12-15 ENCOUNTER — Telehealth: Payer: Self-pay | Admitting: Cardiovascular Disease

## 2016-12-15 NOTE — Telephone Encounter (Signed)
I spoke with pt and reviewed CT results with him 

## 2016-12-15 NOTE — Telephone Encounter (Signed)
I returned call to pt but voicemail not set up

## 2016-12-15 NOTE — Telephone Encounter (Signed)
New Message   pt verbalized that he is calling for rn   To get results for the CT

## 2016-12-26 ENCOUNTER — Other Ambulatory Visit: Payer: Self-pay | Admitting: Cardiovascular Disease

## 2017-01-24 IMAGING — CT CT ANGIO CHEST
2 of 7 series · 19 of 46 positions shown · IV contrast (isovue)
Comparison: 04/28/2011

CLINICAL DATA: Followup thoracic aortic aneurysm

EXAM:
CT ANGIOGRAPHY CHEST WITH CONTRAST
TECHNIQUE: Multidetector CT imaging of the chest was performed using the
standard protocol during bolus administration of intravenous
contrast. Multiplanar CT image reconstructions and MIPs were
obtained to evaluate the vascular anatomy.
CONTRAST:  100 cc of Isovue 370

[Series 4: aorta 3.0 i31f 2 · axial · 0.69mm/px · z∈[-263,-17]mm · 16 of 90 slices shown]
[im 4/90  lung]
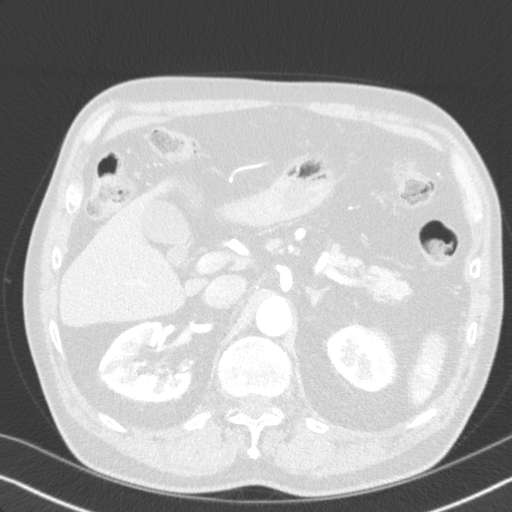
[im 11/90  soft-tissue]
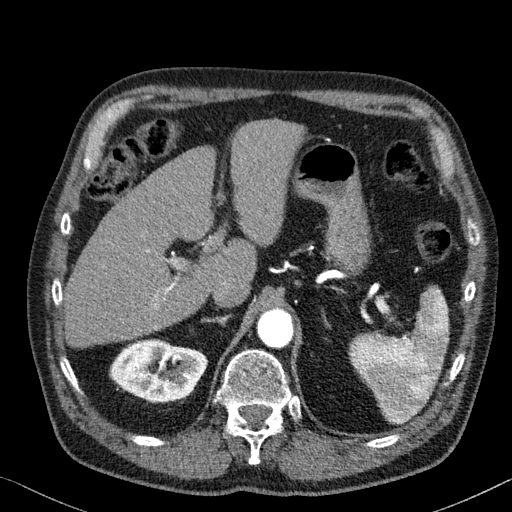
[im 15/90  lung]
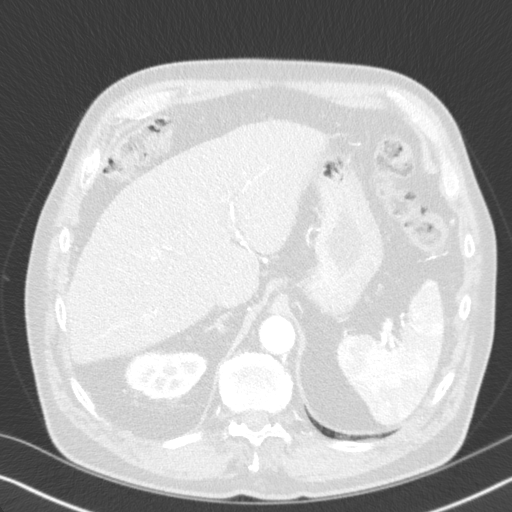
[im 22/90  soft-tissue]
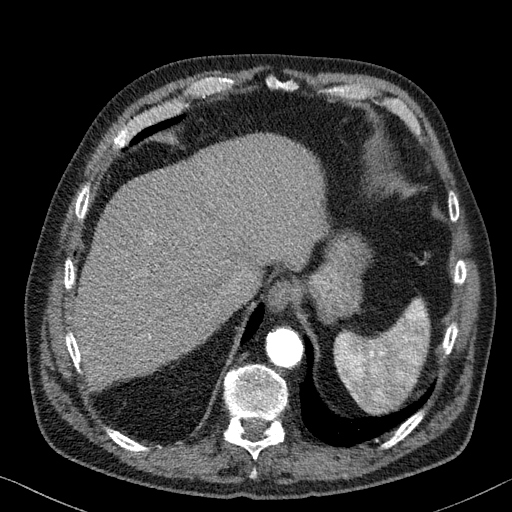
[im 25/90  lung]
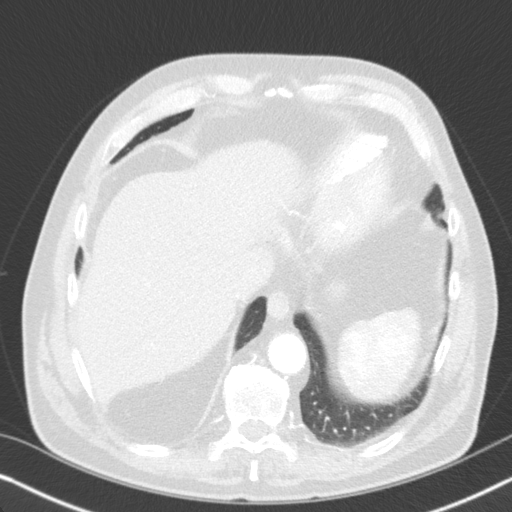
[im 33/90  soft-tissue]
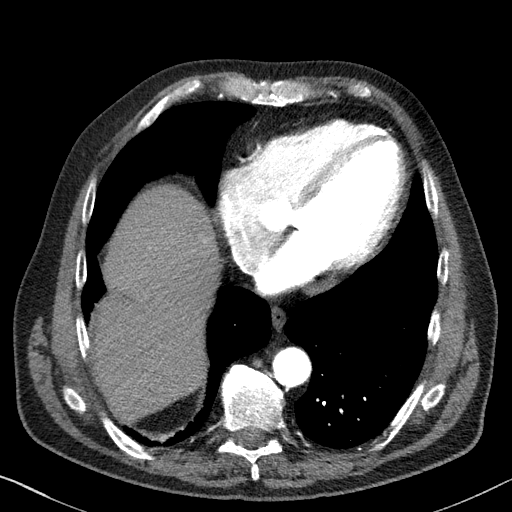
[im 36/90  lung]
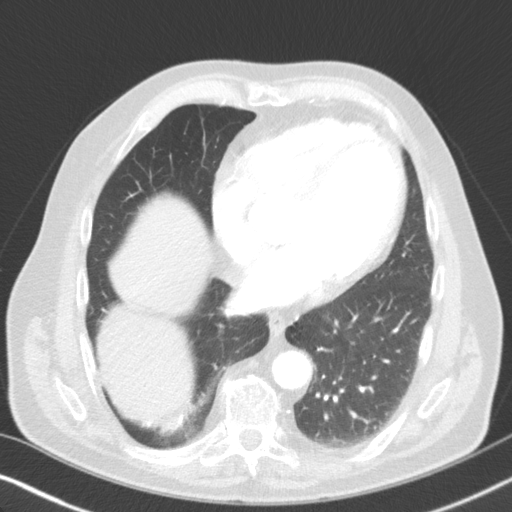
[im 43/90  soft-tissue]
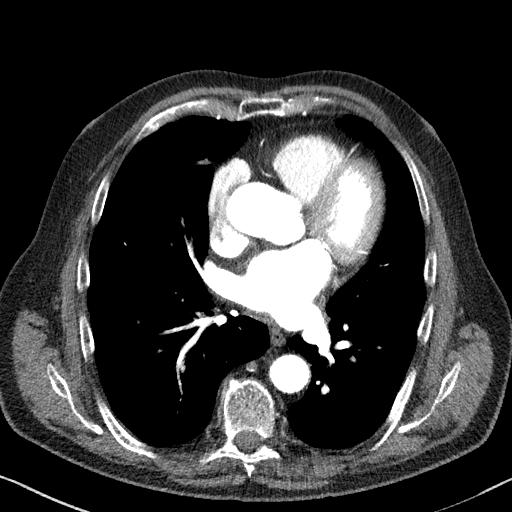
[im 47/90  lung]
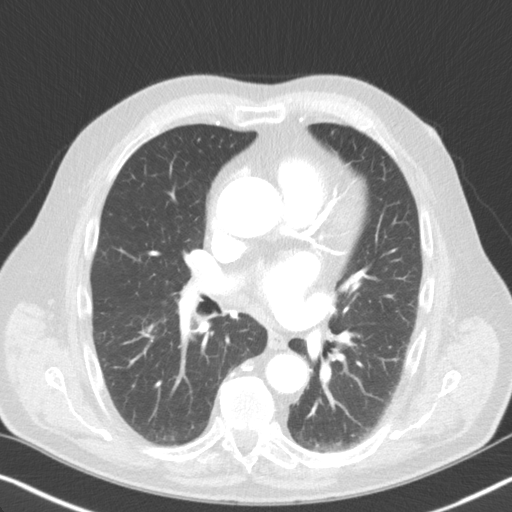
[im 54/90  soft-tissue]
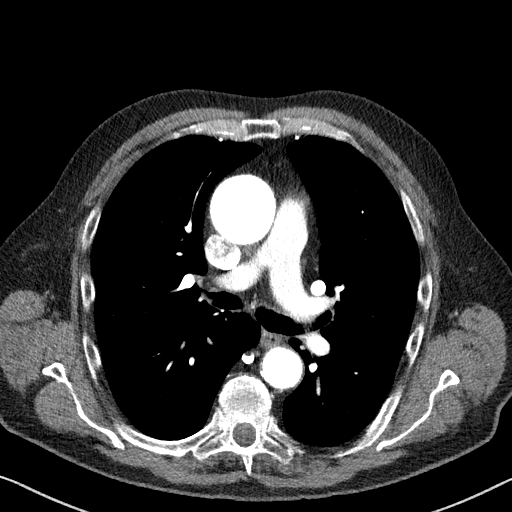
[im 57/90  lung]
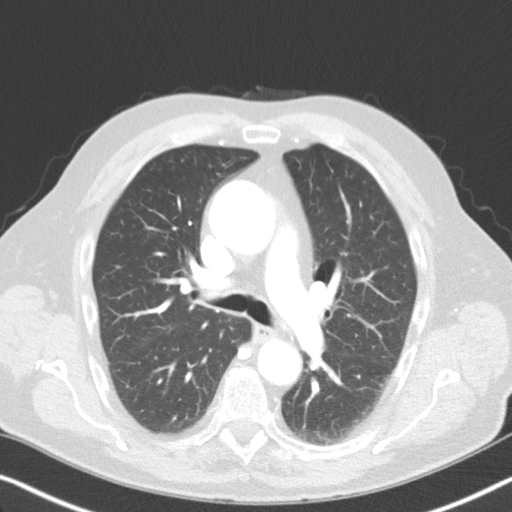
[im 65/90  soft-tissue]
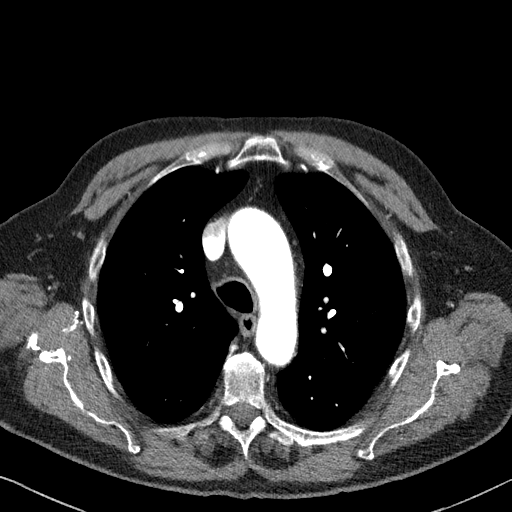
[im 68/90  lung]
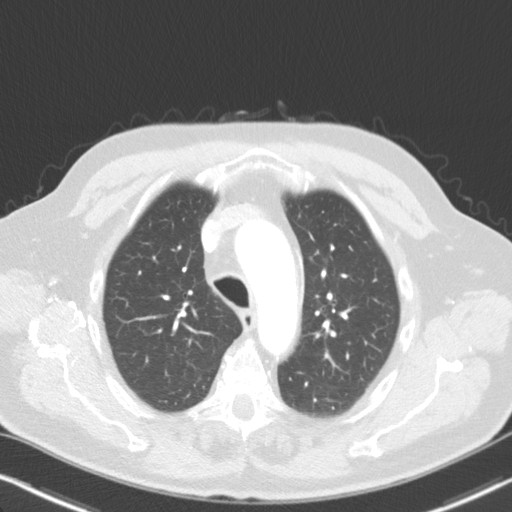
[im 75/90  soft-tissue]
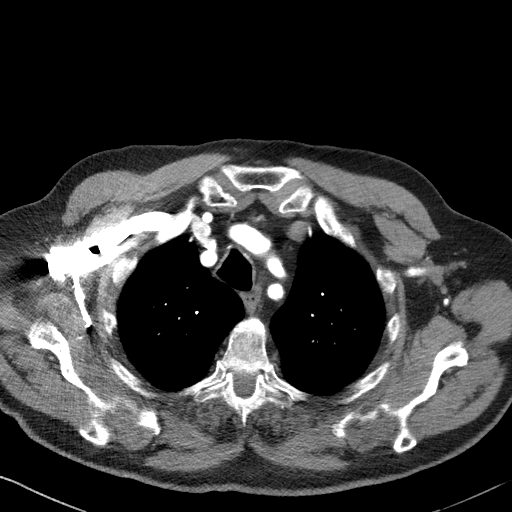
[im 79/90  lung]
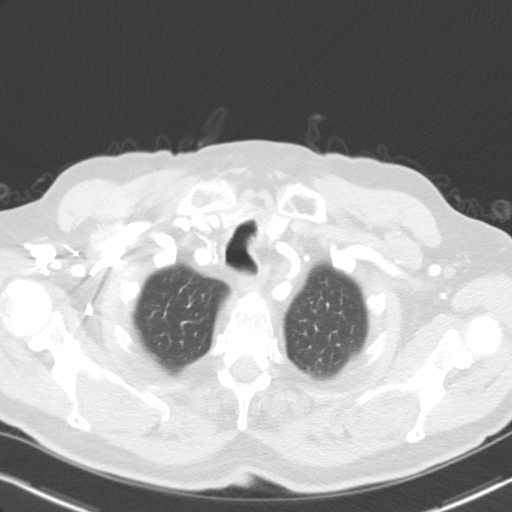
[im 86/90  soft-tissue]
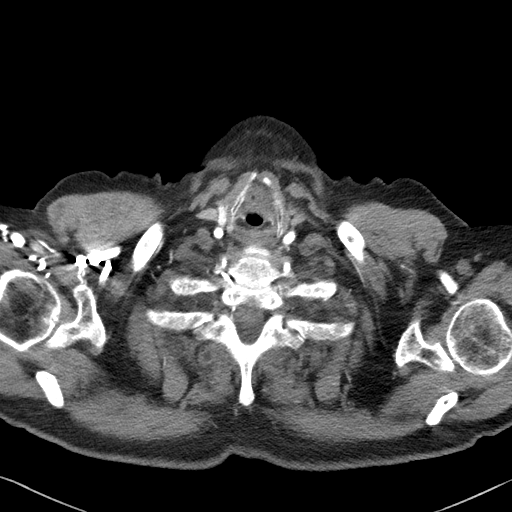

[Series 7: coronals · coronal · 0.62mm/px · 3 of 133 slices shown]
[im 34/133  soft-tissue]
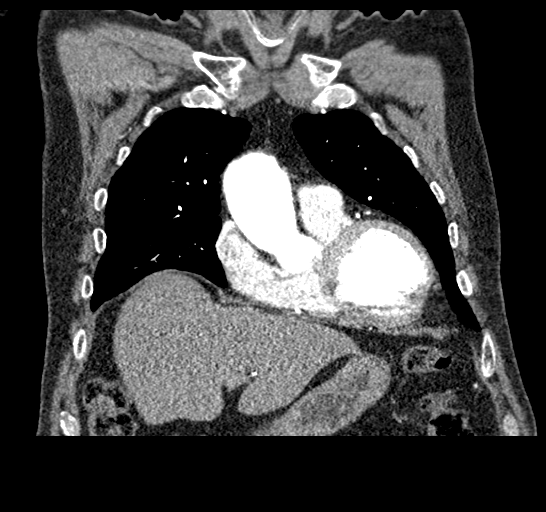
[im 67/133  soft-tissue]
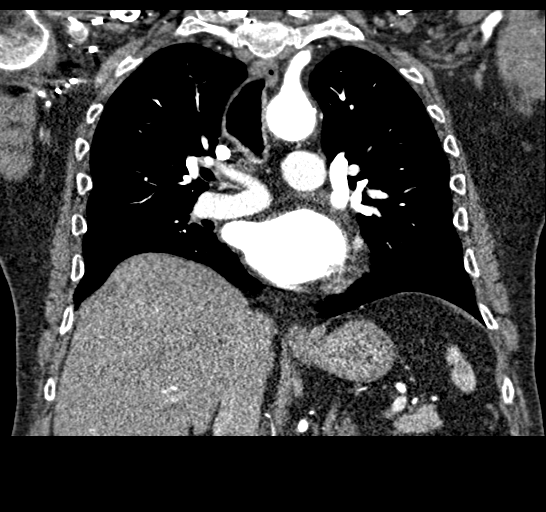
[im 100/133  soft-tissue]
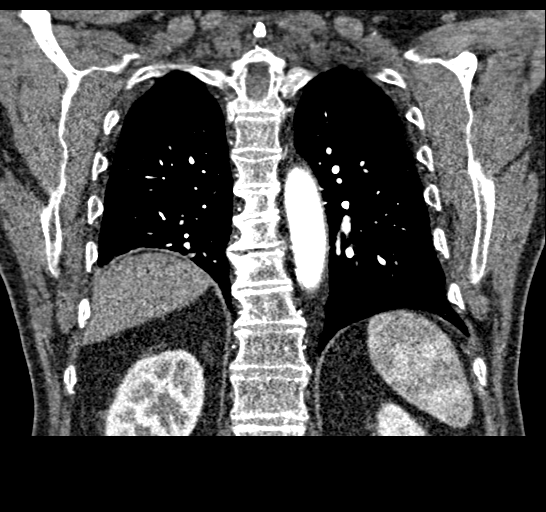

[19 of 46 positions shown; findings below may reference images not displayed]

FINDINGS: Mediastinum/Lymph Nodes: Mild cardiac enlargement. Aortic
atherosclerosis. The ascending thoracic aorta measures 4.4 cm, image
43 of series 4. Previously 4.2 cm. The descending thoracic aorta
Measures 2.6 cm, image 29 of series 4. Unchanged from previous exam.
The transverse aortic arch measures 3.7 cm, image 29 of series 4.
Also unchanged from previous exam. Stent within the LAD coronary
artery noted. The trachea appears patent and is midline. Normal
appearance of the esophagus. No mediastinal or hilar adenopathy.
There is no axillary or supraclavicular adenopathy.

Lungs/Pleura: No pleural fluid. No airspace consolidation noted.
Scar like density within the right middle lobe and posterior medial
right lower lobe noted.

Upper abdomen: No acute findings.

Musculoskeletal: There are no aggressive lytic or sclerotic bone
lesions identified.

Review of the MIP images confirms the above findings.
IMPRESSION: 1. Ascending aortic aneurysm measures 4.4 cm. Increased from 4.2 cm
previously. Recommend annual imaging followup by CTA or MRA. This
recommendation follows 2414
ACCF/AHA/AATS/ACR/ASA/SCA/NIMMYEL/RANTONA/HOGUE/TIGER Guidelines for the
Diagnosis and Management of Patients with Thoracic Aortic Disease.
Circulation. 2414; 121: [id number]
2. LAD coronary artery stent.

## 2017-02-14 ENCOUNTER — Other Ambulatory Visit: Payer: Self-pay | Admitting: Cardiovascular Disease

## 2017-02-24 ENCOUNTER — Other Ambulatory Visit: Payer: Self-pay | Admitting: Cardiovascular Disease

## 2017-04-13 ENCOUNTER — Other Ambulatory Visit: Payer: Self-pay | Admitting: Cardiovascular Disease

## 2017-04-30 DIAGNOSIS — J0100 Acute maxillary sinusitis, unspecified: Secondary | ICD-10-CM | POA: Diagnosis not present

## 2017-04-30 DIAGNOSIS — H81399 Other peripheral vertigo, unspecified ear: Secondary | ICD-10-CM | POA: Diagnosis not present

## 2017-05-01 DIAGNOSIS — Z961 Presence of intraocular lens: Secondary | ICD-10-CM | POA: Diagnosis not present

## 2017-05-01 DIAGNOSIS — H43811 Vitreous degeneration, right eye: Secondary | ICD-10-CM | POA: Diagnosis not present

## 2017-05-01 DIAGNOSIS — H353131 Nonexudative age-related macular degeneration, bilateral, early dry stage: Secondary | ICD-10-CM | POA: Diagnosis not present

## 2017-05-01 DIAGNOSIS — H35373 Puckering of macula, bilateral: Secondary | ICD-10-CM | POA: Diagnosis not present

## 2017-07-16 DIAGNOSIS — H81399 Other peripheral vertigo, unspecified ear: Secondary | ICD-10-CM | POA: Diagnosis not present

## 2017-09-03 ENCOUNTER — Telehealth: Payer: Self-pay

## 2017-09-03 DIAGNOSIS — K222 Esophageal obstruction: Secondary | ICD-10-CM | POA: Diagnosis not present

## 2017-09-03 DIAGNOSIS — K648 Other hemorrhoids: Secondary | ICD-10-CM | POA: Diagnosis not present

## 2017-09-03 DIAGNOSIS — R1310 Dysphagia, unspecified: Secondary | ICD-10-CM | POA: Diagnosis not present

## 2017-09-03 NOTE — Telephone Encounter (Signed)
   [facility] Pre-operative Risk Assessment    Request for surgical clearance:  1. What type of surgery is being performed? EGD (esophagogastroduodenoscopy)   2. When is this surgery scheduled? April 11th, 2019   3. What type of clearance is required (medical clearance vs. Pharmacy clearance to hold med vs. Both)? Pharmacy  4. Are there any medications that need to be held prior to surgery and how long? Plavix, 09/19/17-09/24/17  5. Practice name and name of physician performing surgery? [facility], Dr. Lyda Jester   6. What is your office phone and fax number? [phone], fax: [phone]   7. Anesthesia type (None, local, MAC, general) ? Not Listed   Martin [name] 09/03/2017, 11:04 AM  _________________________________________________________________   (provider comments below)

## 2017-09-03 NOTE — Telephone Encounter (Signed)
Pt had remote PCI and is on Plavix. Will route to Dr. Angelena Form to comment on holding Plavix for 5 days prior to EGD.  Dr. Angelena Form, please route response back tot he preop pool.

## 2017-09-06 NOTE — Telephone Encounter (Signed)
OK to hold plavix prior to his procedure.   Lauree Chandler

## 2017-09-24 DIAGNOSIS — Z87891 Personal history of nicotine dependence: Secondary | ICD-10-CM | POA: Diagnosis not present

## 2017-09-24 DIAGNOSIS — R1310 Dysphagia, unspecified: Secondary | ICD-10-CM | POA: Diagnosis not present

## 2017-09-24 DIAGNOSIS — Z7902 Long term (current) use of antithrombotics/antiplatelets: Secondary | ICD-10-CM | POA: Diagnosis not present

## 2017-09-24 DIAGNOSIS — Z79899 Other long term (current) drug therapy: Secondary | ICD-10-CM | POA: Diagnosis not present

## 2017-09-24 DIAGNOSIS — I10 Essential (primary) hypertension: Secondary | ICD-10-CM | POA: Diagnosis not present

## 2017-09-24 DIAGNOSIS — K449 Diaphragmatic hernia without obstruction or gangrene: Secondary | ICD-10-CM | POA: Diagnosis not present

## 2017-09-24 DIAGNOSIS — K219 Gastro-esophageal reflux disease without esophagitis: Secondary | ICD-10-CM | POA: Diagnosis not present

## 2017-09-24 DIAGNOSIS — Z955 Presence of coronary angioplasty implant and graft: Secondary | ICD-10-CM | POA: Diagnosis not present

## 2017-09-24 DIAGNOSIS — K222 Esophageal obstruction: Secondary | ICD-10-CM | POA: Diagnosis not present

## 2017-10-12 ENCOUNTER — Other Ambulatory Visit: Payer: Self-pay | Admitting: Cardiovascular Disease

## 2017-10-22 DIAGNOSIS — J0100 Acute maxillary sinusitis, unspecified: Secondary | ICD-10-CM | POA: Diagnosis not present

## 2017-10-22 DIAGNOSIS — J209 Acute bronchitis, unspecified: Secondary | ICD-10-CM | POA: Diagnosis not present

## 2018-01-11 ENCOUNTER — Other Ambulatory Visit: Payer: Self-pay | Admitting: Cardiovascular Disease

## 2018-03-19 ENCOUNTER — Other Ambulatory Visit: Payer: Self-pay | Admitting: Cardiovascular Disease

## 2018-04-05 ENCOUNTER — Other Ambulatory Visit: Payer: Self-pay | Admitting: Cardiovascular Disease

## 2018-05-14 ENCOUNTER — Other Ambulatory Visit: Payer: Self-pay | Admitting: Cardiovascular Disease

## 2018-05-18 ENCOUNTER — Other Ambulatory Visit: Payer: Self-pay | Admitting: Cardiovascular Disease

## 2018-05-18 MED ORDER — METOPROLOL TARTRATE 25 MG PO TABS: 25.0000 mg | ORAL_TABLET | Freq: Every day | ORAL | 0 refills | Status: DC

## 2018-05-21 ENCOUNTER — Telehealth: Payer: Self-pay | Admitting: Cardiovascular Disease

## 2018-05-21 MED ORDER — CLOPIDOGREL BISULFATE 75 MG PO TABS: 75.0000 mg | ORAL_TABLET | Freq: Every day | ORAL | 2 refills | Status: DC

## 2018-05-21 MED ORDER — METOPROLOL TARTRATE 25 MG PO TABS: 25.0000 mg | ORAL_TABLET | Freq: Every day | ORAL | 2 refills | Status: DC

## 2018-05-21 MED ORDER — ATORVASTATIN CALCIUM 80 MG PO TABS: 80.0000 mg | ORAL_TABLET | Freq: Every day | ORAL | 2 refills | Status: DC

## 2018-05-21 NOTE — Telephone Encounter (Signed)
Left message to call back  

## 2018-05-21 NOTE — Telephone Encounter (Signed)
I spoke with pt and scheduled him to see Melina Copa, PA on January 16,2020 at 8:00.  Refills for metoprolol, atorvastatin and plavix sent to [facility] on [address] in [place]. He is not taking lasix and potassium.  Pt aware to keep appointment on January 16 for additional refills.

## 2018-05-21 NOTE — Telephone Encounter (Signed)
New message:   Patient calling to get a sooner appt. Patient needs to see Dr. To get refills patient is only home on Thursday and Fridays. Please call on his cell phone.

## 2018-06-17 ENCOUNTER — Other Ambulatory Visit: Payer: Self-pay | Admitting: Cardiovascular Disease

## 2018-06-28 ENCOUNTER — Encounter: Payer: Self-pay | Admitting: Physician Assistant

## 2018-06-28 NOTE — Progress Notes (Signed)
Cardiology Office Note    Date:  07/01/2018  ID:  Dago, Jungwirth 12/18/1940, MRN [id number] PCP:  Myrlene Broker, MD  Cardiologist:  Lauree Chandler, MD   Chief Complaint: overdue f/u CAD  History of Present Illness:  JUWUAN SEDITA is a 78 y.o. male with history of CAD (DES to ostial-prox LAD 2011), HLD, thoracic aortic aneurysm, chronic diastolic CHF, PVCs, former tobacco abuse here today for overdue cardiac follow-up. Last cardiac cath September 2014 with stable CAD (20% distal LM, 40% OM1, 50% mid-distal AV groove Cx, 70% distal RCA, diffusely diseased PDA, EF 50%). Nuclear stress test June 2017 with no ischemia, EF 49%. He has been treated with Lasix for LE edema felt to be due to diastolic CHF in the past. He is known to have a thoracic aortic aneurysm. Last CTA June 2018 with 4.4 cm ascending aortic aneurysm, otherwise with mild emphysema by that study. Last labs 11/2016 normal CMET K 4.2, Cr 0.92, LDL 113, 2014 CBC Hgb 15.5.  He returns for overdue followup feeling great. No CP, SOB, palpitations, diaphoresis, fatigue, edema. He states he feels fortunate to have good health without any issues. 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 He indicates his DOT examiner has been the one to direct when f/u cardiac testing is needed. Currently he does not require any ischemic testing based on symptoms. He's been out of Lopressor a few days because office only gave him short term rx since he was overdue for f/u.   Past Medical History:  Diagnosis Date  . Ascending aortic aneurysm ([place])   . BPH (benign prostatic hyperplasia)   . CAD (coronary artery disease)    a. DES to ostial-prox LAD 2011. b. Last cath 02/2014 showed 20% distal LM, 40% OM1, 50% mid-distal AV groove Cx, 70% distal RCA, diffusely diseased PDA, EF 50%   . Chronic diastolic CHF (congestive heart failure) ([place])   . HLD (hyperlipidemia)   . PVC's  (premature ventricular contractions)     Past Surgical History:  Procedure Laterality Date  . CARDIAC CATHETERIZATION  02/2010   20% left main, 80% ostial/proximal LAD, 60% distal RCA leading into a small PDA which is occluded distally  . CORONARY ANGIOPLASTY  02/2010   LAD: 3.0 X 28 mm Promus DES  . LEFT HEART CATHETERIZATION WITH CORONARY ANGIOGRAM N/A 02/18/2013   Procedure: LEFT HEART CATHETERIZATION WITH CORONARY ANGIOGRAM;  Surgeon: [name], MD;  Location: [facility] CATH LAB;  Service: Cardiovascular;  Laterality: N/A;    Current Medications: Current Meds  Medication Sig  . atorvastatin (LIPITOR) 80 MG tablet Take 1 tablet (80 mg total) by mouth daily. Please keep appointment on 07/01/2018 for additional refills  . clopidogrel (PLAVIX) 75 MG tablet Take 1 tablet (75 mg total) by mouth daily. Please keep appointment on 07/01/18 for additional refills  . furosemide (LASIX) 40 MG tablet Take 1 tablet (40 mg total) by mouth daily.  . metoprolol tartrate (LOPRESSOR) 25 MG tablet Take 1 tablet (25 mg total) by mouth daily. Please keep appointment on 07/01/18 for additional refills  . potassium chloride SA (KLOR-CON M20) 20 MEQ tablet Take 1 tablet (20 mEq total) by mouth daily.      Allergies:   Patient has no known allergies.   Social History   Socioeconomic History  . Marital status: Married    Spouse name: Not on file  . Number  of children: 1  . Years of education: Not on file  . Highest education level: Not on file  Occupational History  . Occupation: truck Animator Needs  . Financial resource strain: Not on file  . Food insecurity:    Worry: Not on file    Inability: Not on file  . Transportation needs:    Medical: Not on file    Non-medical: Not on file  Tobacco Use  . Smoking status: Former Smoker    Packs/day: 3.00    Years: 50.00    Pack years: 150.00    Types: Cigarettes    Last attempt to quit: 03/04/2010    Years since quitting: 8.3  . Smokeless  tobacco: Never Used  Substance and Sexual Activity  . Alcohol use: Yes    Comment: beer on weekend  . Drug use: No  . Sexual activity: Not on file  Lifestyle  . Physical activity:    Days per week: Not on file    Minutes per session: Not on file  . Stress: Not on file  Relationships  . Social connections:    Talks on phone: Not on file    Gets together: Not on file    Attends religious service: Not on file    Active member of club or organization: Not on file    Attends meetings of clubs or organizations: Not on file    Relationship status: Not on file  Other Topics Concern  . Not on file  Social History Narrative  . Not on file     Family History:  The patient's family history includes Bone cancer in his brother; Heart attack in his father; Heart disease in his father; Ovarian cancer in his mother. There is no history of Hypertension or Stroke.  ROS:   Please see the history of present illness.  All other systems are reviewed and otherwise negative.    PHYSICAL EXAM:   VS:  BP 130/90   Pulse 66   Ht 5\' 7"  (1.702 m)   Wt 173 lb 12.8 oz (78.8 kg)   SpO2 96%   BMI 27.22 kg/m   BMI: Body mass index is 27.22 kg/m. GEN: Well nourished, well developed WM, in no acute distress HEENT: normocephalic, atraumatic Neck: no JVD, carotid bruits, or masses Cardiac: RRR; no murmurs, rubs, or gallops, no edema  Respiratory:  clear to auscultation bilaterally, normal work of breathing GI: soft, nontender, nondistended, + BS MS: no deformity or atrophy Skin: warm and dry, no rash Neuro:  Alert and Oriented x 3, Strength and sensation are intact, follows commands Psych: euthymic mood, full affect  Wt Readings from Last 3 Encounters:  07/01/18 173 lb 12.8 oz (78.8 kg)  12/08/16 163 lb (73.9 kg)  11/27/15 171 lb (77.6 kg)      Studies/Labs Reviewed:   EKG:  EKG was ordered today and personally reviewed by me and demonstrates NSR 66bpm, nonspecific STT changes, prior inferior  infarct no acute changes  Recent Labs: No results found for requested labs within last 8760 hours.   Lipid Panel    Component Value Date/Time   CHOL 179 12/08/2016 1141   TRIG 83 12/08/2016 1141   HDL 49 12/08/2016 1141   CHOLHDL 3.7 12/08/2016 1141   CHOLHDL 2.5 11/27/2015 0811   VLDL 14 11/27/2015 0811   LDLCALC 113 (H) 12/08/2016 1141   LDLDIRECT 131.1 12/16/2011 0834    Additional studies/ records that were reviewed today include: Summarized above  ASSESSMENT & PLAN:   1. CAD - doing great on Plavix monotherapy. He has been out of Lopressor for a few days. This is only once a day for unclear reasons. Will refill but switch to Toprol (equivalent dose 25mg ). 2. Hyperlipidemia - continue statin. He ate this AM. Will check lipids when he returns for CT as below. 3. Thoracic aortic aneurysm - will update CT angio. Aneurysm precautions reviewed and added to AVS as well. Continue BB as above. Suspect resumption would bring his BP nicely within goal. He reports this is always normal at home. 4. PVCs - asymptomatic, quiescent on EKG and exam. Continue beta blocker. 5. Chronic diastolic CHF - appears euvolemic. Update lytes/Cr. Continue Lasix.  Disposition: F/u with Dr. Angelena Form in 1 year.  Medication Adjustments/Labs and Tests Ordered: Current medicines are reviewed at length with the patient today.  Concerns regarding medicines are outlined above. Medication changes, Labs and Tests ordered today are summarized above and listed in the Patient Instructions accessible in Encounters.   Signed, Charlie Pitter, PA-C  07/01/2018 8:04 AM    [facility] [address], [place], [place]  [address] Phone: [phone]; Fax: [phone]

## 2018-07-01 ENCOUNTER — Encounter (INDEPENDENT_AMBULATORY_CARE_PROVIDER_SITE_OTHER): Payer: Self-pay

## 2018-07-01 ENCOUNTER — Ambulatory Visit: Payer: PPO | Admitting: Physician Assistant

## 2018-07-01 ENCOUNTER — Encounter: Payer: Self-pay | Admitting: Physician Assistant

## 2018-07-01 ENCOUNTER — Telehealth: Payer: Self-pay | Admitting: Physician Assistant

## 2018-07-01 VITALS — BP 130/90 | HR 66 | Ht 67.0 in | Wt 173.8 lb

## 2018-07-01 DIAGNOSIS — I712 Thoracic aortic aneurysm, without rupture, unspecified: Secondary | ICD-10-CM

## 2018-07-01 DIAGNOSIS — I2510 Atherosclerotic heart disease of native coronary artery without angina pectoris: Secondary | ICD-10-CM | POA: Diagnosis not present

## 2018-07-01 DIAGNOSIS — I5032 Chronic diastolic (congestive) heart failure: Secondary | ICD-10-CM | POA: Diagnosis not present

## 2018-07-01 DIAGNOSIS — I493 Ventricular premature depolarization: Secondary | ICD-10-CM | POA: Diagnosis not present

## 2018-07-01 DIAGNOSIS — E785 Hyperlipidemia, unspecified: Secondary | ICD-10-CM

## 2018-07-01 LAB — COMPREHENSIVE METABOLIC PANEL
ALT: 12 IU/L (ref 0–44)
AST: 18 IU/L (ref 0–40)
Albumin/Globulin Ratio: 2.2 (ref 1.2–2.2)
Albumin: 4.1 g/dL (ref 3.5–4.8)
Alkaline Phosphatase: 118 IU/L — ABNORMAL HIGH (ref 39–117)
BILIRUBIN TOTAL: 0.7 mg/dL (ref 0.0–1.2)
BUN/Creatinine Ratio: 15 (ref 10–24)
BUN: 16 mg/dL (ref 8–27)
CHLORIDE: 105 mmol/L (ref 96–106)
CO2: 22 mmol/L (ref 20–29)
Calcium: 9.1 mg/dL (ref 8.6–10.2)
Creatinine, Ser: 1.09 mg/dL (ref 0.76–1.27)
GFR calc Af Amer: 75 mL/min/{1.73_m2} (ref >59)
GFR calc non Af Amer: 65 mL/min/{1.73_m2} (ref >59)
Globulin, Total: 1.9 g/dL (ref 1.5–4.5)
Glucose: 91 mg/dL (ref 65–99)
POTASSIUM: 4.4 mmol/L (ref 3.5–5.2)
Sodium: 142 mmol/L (ref 134–144)
Total Protein: 6 g/dL (ref 6.0–8.5)

## 2018-07-01 LAB — CBC
Hematocrit: 40.9 % (ref 37.5–51.0)
Hemoglobin: 13.8 g/dL (ref 13.0–17.7)
MCH: 30 pg (ref 26.6–33.0)
MCHC: 33.7 g/dL (ref 31.5–35.7)
MCV: 89 fL (ref 79–97)
Platelets: 255 10*3/uL (ref 150–450)
RBC: 4.6 x10E6/uL (ref 4.14–5.80)
RDW: 12.8 % (ref 11.6–15.4)
WBC: 6.3 10*3/uL (ref 3.4–10.8)

## 2018-07-01 MED ORDER — METOPROLOL SUCCINATE ER 25 MG PO TB24: 25.0000 mg | ORAL_TABLET | Freq: Every day | ORAL | 3 refills | Status: DC

## 2018-07-01 MED ORDER — ATORVASTATIN CALCIUM 80 MG PO TABS: 80.0000 mg | ORAL_TABLET | Freq: Every day | ORAL | 3 refills | Status: DC

## 2018-07-01 MED ORDER — CLOPIDOGREL BISULFATE 75 MG PO TABS: 75.0000 mg | ORAL_TABLET | Freq: Every day | ORAL | 3 refills | Status: DC

## 2018-07-01 NOTE — Telephone Encounter (Signed)
Returned pts call.  He had his medication bottles in front of him and he is not taking the Lasix and Potassium.  Pt denies any swelling. Will make appropriate med changes on pts list.

## 2018-07-01 NOTE — Telephone Encounter (Signed)
New Message:    Pt said he saw Dayna earlier today and he is calling back with some information to give her.

## 2018-07-01 NOTE — Patient Instructions (Addendum)
Medication Instructions:  Your physician has recommended you make the following change in your medication: 1.  1.  STOP the Metoprolol 2.  START Toprol Xl 25 mg taking 1 tablet daily   Check your medication bottles when you get home and see if you're still taking the Furosemide and Potassium.  CALL OUR OFFICE, [phone], TO LET us KNOW. ASK FOR JENNIFER W.  If you need a refill on your cardiac medications before your next appointment, please call your pharmacy.   Lab work: TODAY:  CMET & CBC WHEN COME FOR CT:  FASTING LIPID - nothing to eat or drink after midnight the night before your ct and lab work   If you have labs (blood work) drawn today and your tests are completely normal, you will receive your results only by: Marland Kitchen MyChart Message (if you have MyChart) OR . A paper copy in the mail If you have any lab test that is abnormal or we need to change your treatment, we will call you to review the results.  Testing/Procedures: Non-Cardiac CT Angiography (CTA), is a special type of CT scan that uses a computer to produce multi-dimensional views of major blood vessels throughout the body. In CT angiography, a contrast material is injected through an IV to help visualize the blood vessels   Follow-Up: At [facility], you and your health needs are our priority.  As part of our continuing mission to provide you with exceptional heart care, we have created designated Provider Care Teams.  These Care Teams include your primary Cardiologist (physician) and Advanced Practice Providers (APPs -  Physician Assistants and Nurse Practitioners) who all work together to provide you with the care you need, when you need it. You will need a follow up appointment in 12 months.  Please call our office 2 months in advance to schedule this appointment.  You may see Lauree Chandler, MD or one of the following Advanced Practice Providers on your designated Care Team:   [place], PA-C Melina Copa, PA-C . Ermalinda Barrios, PA-C  Any Other Special Instructions Will Be Listed Below (If Applicable).   Mainstays of therapy for aneurysms include good blood pressure control, healthy lifestyle, and avoiding fluoroquinolone antibiotic medications (such as those in the "Cipro" class, ending in "floxacin") due to risk of damage to the aorta. This is a finding I would expect to be monitored periodically by their primary cardiologist. Since aneurysms can run in families, you should discuss your diagnosis with first degree relatives as they may need to be screened for this. Regular mild-moderate physical exercise is OK but avoid heavy lifting/weight lifting over 30lbs, chopping wood, shoveling snow or digging heavy earth with a shovel.    CT Angiogram  A CT angiogram is a procedure to look at the blood vessels in various areas of the body. For this procedure, a large X-ray machine, called a CT scanner, takes detailed pictures of blood vessels that have been injected with a dye (contrast material). A CT angiogram allows your health care provider to see how well blood is flowing to the area of your body that is being checked. Your health care provider will be able to see if there are any problems, such as a blockage. Tell a health care provider about:  Any allergies you have.  All medicines you are taking, including vitamins, herbs, eye drops, creams, and over-the-counter medicines.  Any problems you or family members have had with anesthetic medicines.  Any blood disorders you have.  Any surgeries you have had.  Any medical conditions you have.  Whether you are pregnant or may be pregnant.  Whether you are breastfeeding.  Any anxiety disorders, chronic pain, or other conditions you have that may increase your stress or prevent you from lying still. What are the risks? Generally, this is a safe procedure. However, problems may occur, including:  Infection.  Bleeding.  Allergic  reactions to medicines or dyes.  Damage to other structures or organs.  Kidney damage from the dye or contrast that is used.  Increased risk of cancer from radiation exposure. This risk is low. Talk with your health care provider about: ? The risks and benefits of testing. ? How you can receive the lowest dose of radiation. What happens before the procedure?  Wear comfortable clothing and remove any jewelry.  Follow instructions from your health care provider about eating and drinking. For most people, instructions may include these actions: ? For 12 hours before the test, avoid caffeine. This includes tea, coffee, soda, and energy drinks or pills. ? For 3-4 hours before the test, stop eating or drinking anything but water. ? Stay well hydrated by continuing to drink water before the exam. This will help to clear the contrast dye from your body after the test.  Ask your health care provider about changing or stopping your regular medicines. This is especially important if you are taking diabetes medicines or blood thinners. What happens during the procedure?  An IV tube will be inserted into one of your veins.  You will be asked to lie on an exam table. This table will slide in and out of the CT machine during the procedure.  Contrast dye will be injected into the IV tube. You might feel warm, or you may get a metallic taste in your mouth.  The table that you are lying on will move into the CT machine tunnel for the scan.  The person running the machine will give you instructions while the scans are being done. You may be asked to: ? Keep your arms above your head. ? Hold your breath. ? Stay very still, even if the table is moving.  When the scanning is complete, you will be moved out of the machine.  The IV tube will be removed. The procedure may vary among health care providers and hospitals. What happens after the procedure?  You might feel warm, or you may get a metallic  taste in your mouth.  You may be asked to drink water or other fluids to wash (flush) the contrast material out of your body.  It is up to you to get the results of your procedure. Ask your health care provider, or the department that is doing the procedure, when your results will be ready. Summary  A CT angiogram is a procedure to look at the blood vessels in various areas of the body.  You will need to stay very still during the exam.  You may be asked to drink water or other fluids to wash (flush) the contrast material out of your body after your scan. This information is not intended to replace advice given to you by your health care provider. Make sure you discuss any questions you have with your health care provider. Document Released: 01/31/2016 Document Revised: 01/31/2016 Document Reviewed: 01/31/2016 Elsevier Interactive Patient Education  [employer].

## 2018-07-02 ENCOUNTER — Telehealth: Payer: Self-pay | Admitting: *Deleted

## 2018-07-02 NOTE — Telephone Encounter (Signed)
Called pt re: lab results, left a message for pt to call back.  

## 2018-07-02 NOTE — Telephone Encounter (Signed)
-----  Message from Charlie Pitter, [place] sent at 07/01/2018  4:48 PM EST ----- Please let patient know labs were normal except alk phos mildly elevated - can be a marker of a liver/bone issue. It's very mild but should be evaluated/monitored by PCP. He doesn't routinely see one so please refer if needed. Dayna Dunn PA-C

## 2018-07-09 NOTE — Telephone Encounter (Signed)
2nd attempt to reach pt re: lab results, left another message for pt to call back. 

## 2018-07-21 ENCOUNTER — Encounter: Payer: Self-pay | Admitting: *Deleted

## 2018-07-21 NOTE — Telephone Encounter (Signed)
3rd attempt to reach pt by phone to discuss lab results.  I have been unable to reach this patient by phone.  A letter is being sent to the last known home address. 07/21/2018

## 2018-07-22 ENCOUNTER — Telehealth: Payer: Self-pay | Admitting: *Deleted

## 2018-07-22 ENCOUNTER — Ambulatory Visit (INDEPENDENT_AMBULATORY_CARE_PROVIDER_SITE_OTHER)
Admission: RE | Admit: 2018-07-22 | Discharge: 2018-07-22 | Disposition: A | Discharge status: Home / Self Care | Payer: PPO | Source: Ambulatory Visit | Attending: Physician Assistant | Admitting: Physician Assistant

## 2018-07-22 DIAGNOSIS — I712 Thoracic aortic aneurysm, without rupture, unspecified: Secondary | ICD-10-CM

## 2018-07-22 MED ORDER — IOPAMIDOL (ISOVUE-370) INJECTION 76%
100.0000 mL | Freq: Once | INTRAVENOUS | Status: AC | PRN
  Administered 2018-07-22: 100 mL via INTRAVENOUS

## 2018-07-22 NOTE — Telephone Encounter (Signed)
-----   Message from Charlie Pitter, [place] sent at 07/22/2018 12:30 PM EST ----- Please let pt know CT scan shows that aneurysm is 4.5cm (previously 4.4), not significantly changed. There is nothing we need to intervene on but at this point he would benefit from seeing one of our cardiothoracic surgeons to establish the best plan of action for monitoring as per radiologist recommendation. Please arrange. If he happens to decline, needs a repeat CT scan (same kind) in 6 months. Dayna Dunn PA-C

## 2018-07-23 ENCOUNTER — Telehealth: Payer: Self-pay | Admitting: Physician Assistant

## 2018-07-23 NOTE — Telephone Encounter (Signed)
Pt stated that his safety man took him out of work due to his anuerysm.  He is wanting to know if it's ok for him to return?  He is a Administrator.  If he can, he will need a letter to return.  Please advise!

## 2018-07-23 NOTE — Telephone Encounter (Signed)
New Message ° ° °Patient returning phone call about results. °

## 2018-07-23 NOTE — Telephone Encounter (Signed)
Called pt re: returning to work.  Left a message for him to call back.

## 2018-07-23 NOTE — Telephone Encounter (Signed)
Returned pts call and left a message for pt to call back. 

## 2018-07-23 NOTE — Telephone Encounter (Signed)
This actually poses a really interesting conundrum and I'm glad he asked. 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 His aneurysm is 4.5cm. He actually has had an aneurysm larger than 3.5 since at least 2012, therefore I am surprised his DOT examiner has not mentioned this as a stop measure in the past. I would advise he discuss with his DOT examiner since they have been the ones to clear him over the years with this diagnosis, as the guidelines I have to reference are very black-and-white that he should not be allowed to. There is no room for interpretation/provider discretion according to the handbook unless someone has had a surgical repair, which he does not presently require. The DOT examiner may have access to more extensive guidelines that take into account stability so I would encourage he reach out to them to discuss. I will also cc to Dr. Angelena Form to make him aware.  Toshua Honsinger PA-C

## 2018-07-23 NOTE — Telephone Encounter (Signed)
New Message   Patient is calling is reference to his CT results. He is not understanding the results and what he needs to do. Please call.

## 2018-07-26 NOTE — Telephone Encounter (Signed)
Thanks Dayna.    

## 2018-07-28 ENCOUNTER — Telehealth: Payer: Self-pay | Admitting: *Deleted

## 2018-07-28 NOTE — Telephone Encounter (Signed)
Called pt about his referral to [facility] and upcoming appt. Left pt a message to call back.

## 2018-07-28 NOTE — Telephone Encounter (Signed)
Returned pts call and left another message for pt to call back. 

## 2018-07-28 NOTE — Telephone Encounter (Signed)
Pt returned my call.  He was inquiring about his appt with Dr. Cyndia Bent for 08/18/2018 and was wanting something sooner. Pt was advised that we don't schedule for their office, but I did provide pt with their number to call and see what they had available. Pt thanked me for my assistance.

## 2018-07-28 NOTE — Telephone Encounter (Signed)
Pt walked into the office to speak with me re: work note. Printed off Melina Copa, PA-'s recommendations re: returning to work and spoke with the pt. Pt verbalized understanding and thanked me for taking the time to speak with him.

## 2018-07-28 NOTE — Telephone Encounter (Signed)
F/U Message ° ° ° ° ° ° ° ° ° °Patient returned your call °

## 2018-08-18 ENCOUNTER — Institutional Professional Consult (permissible substitution): Payer: PPO | Admitting: Surgery

## 2018-08-18 ENCOUNTER — Other Ambulatory Visit: Payer: Self-pay

## 2018-08-18 ENCOUNTER — Encounter: Payer: Self-pay | Admitting: Surgery

## 2018-08-18 VITALS — BP 118/81 | HR 51 | Resp 18 | Ht 67.0 in | Wt 169.8 lb

## 2018-08-18 DIAGNOSIS — I712 Thoracic aortic aneurysm, without rupture, unspecified: Secondary | ICD-10-CM

## 2018-08-18 NOTE — Progress Notes (Signed)
Cardiothoracic Surgery Consultation   PCP is Myrlene Broker, MD Referring Provider is Charlie Pitter, PA-C  Chief Complaint  Patient presents with  . Thoracic Aortic Aneurysm    Surgical eval, CTA Chest 07/22/2018    HPI:  The patient is a 78 year old gentleman with a history of hyperlipidemia, coronary artery disease status post PCI of the LAD with a drug-eluting stent in 4944, chronic diastolic congestive heart failure, and ascending aortic aneurysm that has been followed with periodic CT scans.  He was recently seen in follow-up by cardiology on 07/01/2018 and had a follow-up CTA of the chest on 07/22/2018 which showed the maximum diameter of the ascending aortic aneurysm to be 4.5 cm.  This did not change significantly from his prior CT scan in June 2018 when it was measured at 4.4 cm.  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  The patient is here today with his wife.  His wife side the family has a strong history of aortic aneurysm disease but he has no history of aortic aneurysm disease on his side of the family.  His wife said that her father died in his 48s of a ruptured ascending aneurysm and her daughter also died in her 48s of the same thing.  There is no history of connective tissue disorder or aortic dissection on the patient side of the family.  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  He denies any chest pain or pressure.  He has chronic shortness of breath.   Past Medical History:  Diagnosis Date  . Ascending aortic aneurysm ([place])   . BPH (benign prostatic hyperplasia)   . CAD (coronary artery disease)    a. DES to ostial-prox LAD 2011. b. Last cath 02/2014 showed 20% distal LM, 40% OM1, 50% mid-distal AV groove Cx, 70% distal RCA, diffusely diseased PDA,  EF 50%   . Chronic diastolic CHF (congestive heart failure) ([place])   . HLD (hyperlipidemia)   . PVC's (premature ventricular contractions)     Past Surgical History:  Procedure Laterality Date  . CARDIAC CATHETERIZATION  02/2010   20% left main, 80% ostial/proximal LAD, 60% distal RCA leading into a small PDA which is occluded distally  . CORONARY ANGIOPLASTY  02/2010   LAD: 3.0 X 28 mm Promus DES  . LEFT HEART CATHETERIZATION WITH CORONARY ANGIOGRAM N/A 02/18/2013   Procedure: LEFT HEART CATHETERIZATION WITH CORONARY ANGIOGRAM;  Surgeon: [name], MD;  Location: [facility] CATH LAB;  Service: Cardiovascular;  Laterality: N/A;    Family History  Problem Relation Age of Onset  . Ovarian cancer Mother   . Heart disease Father   . Heart attack Father   . Bone cancer Brother   . Hypertension Neg Hx   . Stroke Neg Hx     Social History Social History   Tobacco Use  . Smoking status: Former Smoker    Packs/day: 3.00    Years: 50.00    Pack years: 150.00    Types: Cigarettes    Last attempt to quit: 03/04/2010    Years since quitting: 8.4  . Smokeless tobacco: Never Used  Substance Use Topics  . Alcohol use: Yes    Comment: beer on weekend  . Drug use: No    Current  Outpatient Medications  Medication Sig Dispense Refill  . atorvastatin (LIPITOR) 80 MG tablet Take 1 tablet (80 mg total) by mouth daily. 90 tablet 3  . clopidogrel (PLAVIX) 75 MG tablet Take 1 tablet (75 mg total) by mouth daily. 90 tablet 3  . metoprolol succinate (TOPROL XL) 25 MG 24 hr tablet Take 1 tablet (25 mg total) by mouth daily. 90 tablet 3   No current facility-administered medications for this visit.     No Known Allergies  Review of Systems  Constitutional: Negative for activity change and fatigue.  HENT: Negative.   Eyes: Negative.   Respiratory: Positive for shortness of breath. Negative for chest tightness.   Cardiovascular: Positive for leg swelling. Negative for chest pain and  palpitations.  Gastrointestinal: Negative.   Endocrine: Negative.   Genitourinary: Negative.   Musculoskeletal: Negative.   Skin: Negative.   Allergic/Immunologic: Negative.   Neurological: Negative for dizziness and syncope.  Hematological: Negative.   Psychiatric/Behavioral: Negative.     BP 118/81 (BP Location: Right Arm, Patient Position: Sitting, Cuff Size: Normal)   Pulse (!) 51   Resp 18   Ht 5\' 7"  (1.702 m)   Wt 169 lb 12.8 oz (77 kg)   SpO2 97% Comment: RA  BMI 26.59 kg/m  Physical Exam Constitutional:      Appearance: Normal appearance. He is normal weight.  HENT:     Head: Normocephalic and atraumatic.     Mouth/Throat:     Mouth: Mucous membranes are moist.     Pharynx: Oropharynx is clear.  Eyes:     Extraocular Movements: Extraocular movements intact.     Conjunctiva/sclera: Conjunctivae normal.     Pupils: Pupils are equal, round, and reactive to light.  Neck:     Musculoskeletal: Normal range of motion and neck supple.     Vascular: No carotid bruit.  Cardiovascular:     Rate and Rhythm: Normal rate and regular rhythm.     Pulses: Normal pulses.     Heart sounds: Normal heart sounds. No murmur.  Pulmonary:     Effort: Pulmonary effort is normal.     Breath sounds: Normal breath sounds.  Abdominal:     General: Abdomen is flat. Bowel sounds are normal. There is no distension.     Palpations: Abdomen is soft.     Tenderness: There is no abdominal tenderness.  Musculoskeletal:        General: No swelling or tenderness.  Lymphadenopathy:     Cervical: No cervical adenopathy.  Skin:    General: Skin is warm and dry.  Neurological:     General: No focal deficit present.     Mental Status: He is alert and oriented to person, place, and time.  Psychiatric:        Mood and Affect: Mood normal.        Behavior: Behavior normal.        Thought Content: Thought content normal.        Judgment: Judgment normal.      Diagnostic Tests:  CLINICAL  DATA:  Follow-up thoracic aortic aneurysm  EXAM: CT ANGIOGRAPHY CHEST WITH CONTRAST  TECHNIQUE: Multidetector CT imaging of the chest was performed using the standard protocol during bolus administration of intravenous contrast. Multiplanar CT image reconstructions and MIPs were obtained to evaluate the vascular anatomy.  CONTRAST:  162mL ISOVUE-370 IOPAMIDOL (ISOVUE-370) INJECTION 76%  COMPARISON:  12/09/2016  FINDINGS: Cardiovascular: Maximal diameter of the ascending aorta at the sinus of Valsalva, sino-tubular junction,  and ascending aorta are 4.1 cm, 3.5 cm, and 4.5 cm. Previously, maximal diameter in the ascending aorta was recorded at 4.4 cm. There is no evidence of aortic dissection. There is no obvious acute intramural hematoma. Maximal diameter at the aortic arch is 3.7 cm. Atherosclerotic vascular calcifications of the thoracic aorta are noted. Moderate LAD territory coronary artery calcification. Great vessels are patent.  Mediastinum/Nodes: No abnormal mediastinal adenopathy. No pericardial effusion. Thyroid is heterogeneous but without definitive focal nodule. Unremarkable esophagus.  Lungs/Pleura: Subsegmental atelectasis towards the lung bases. No pulmonary mass or nodule. No pneumothorax or pleural effusion. There is increased AP diameter of the chest.  Upper Abdomen: Subcentimeter hypodensity in the left lobe of the liver is stable supporting benign etiology.  Musculoskeletal: No acute bony injury or new vertebral compression deformity.  Review of the MIP images confirms the above findings.  IMPRESSION: Maximal diameter of the ascending aorta is 4.5 cm compared with 4.4 cm on the prior study. This is not significantly changed. Ascending thoracic aortic aneurysm. Recommend semi-annual imaging followup by CTA or MRA and referral to cardiothoracic surgery if not already obtained. This recommendation follows  2010 ACCF/AHA/AATS/ACR/ASA/SCA/SCAI/SIR/STS/SVM Guidelines for the Diagnosis and Management of Patients With Thoracic Aortic Disease. Circulation. 2010; 121: E321-Y248. Aortic aneurysm NOS (ICD10-I71.9)  Aortic Atherosclerosis (ICD10-I70.0).   Electronically Signed   By: Marybelle Killings M.D.   On: 07/22/2018 11:38  Impression:  This 78 year old gentleman has a stable 4.5 cm fusiform ascending aortic aneurysm.  I have personally reviewed his most recent CTA of the chest as well as his prior CT scans of the chest dating back to 2004.  This aneurysm measured 4.0 cm in 2004 and 4.2 cm in 2012.  The measurement last year was 4.4 cm but I will think there is been a significant change since last year.  This is still well below the surgical threshold of 5.5 cm.  I think the risk of him having a problem with his aorta at 4.5 cm is much less than 1% and certainly less likely than the risk of him having significant coronary ischemia given his history of coronary disease.  I reviewed the CTA studies with the patient and his wife and answered their questions.  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  I advised him to personally check into the current regulations and see if there are any exceptions.  I stressed the importance of good blood pressure control and preventing further enlargement and aortic dissection.  I advised him against doing any heavy lifting of more than 35 pounds which may lead to performing a Valsalva maneuver and raising his blood pressure suddenly.  I recommended doing a follow-up CTA of the chest in 1 year.  Plan:  The patient will return to see me in 1 year with a CTA of the chest   I spent 45 minutes performing this consultation and > 50% of this time was spent face to face counseling and coordinating the care of this patient's ascending aortic aneurysm.   Gaye Pollack, MD [facility] [phone]

## 2018-09-01 ENCOUNTER — Telehealth: Payer: Self-pay | Admitting: *Deleted

## 2018-09-01 NOTE — Telephone Encounter (Signed)
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 Form completed by Dr. Angelena Form and I mailed to pt. I placed call to let pt know but voicemail is not set up.

## 2018-11-29 ENCOUNTER — Telehealth: Payer: Self-pay | Admitting: Cardiovascular Disease

## 2018-11-29 NOTE — Telephone Encounter (Signed)
New message:    Patient calling concerning her husband needing appt soon and to send some papers also. Please patient wife.

## 2018-11-29 NOTE — Telephone Encounter (Signed)
Wife called back to provide an email address. I put the email address in the patient's chart and generated a MyChart registration code.

## 2018-11-29 NOTE — Telephone Encounter (Signed)
I spoke with pt's wife.  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 Wife reports Dr. Angelena Form completed initial paperwork for this.  Pt has additional paperwork that needs to be completed for retirement and they are asking Dr. Angelena Form to complete.   They cannot fax and would like to send through computer.  Wife will call back with email address so we can send my chart activation information to pt. Wife was unable to get  Activation code as text message.

## 2018-12-01 ENCOUNTER — Telehealth: Payer: Self-pay | Admitting: Cardiovascular Disease

## 2018-12-01 NOTE — Telephone Encounter (Signed)
Patient needs to send an email that contains a link for Dr Angelena Form to fill out paperwork for disability for the patient. Please call with instructions

## 2018-12-02 NOTE — Telephone Encounter (Signed)
Left message to call back  

## 2018-12-03 NOTE — Telephone Encounter (Signed)
Left message to call back  

## 2018-12-07 NOTE — Telephone Encounter (Signed)
Follow up   Patient is returning Pat A.call. Please call at the phone # that the patient's wife has listed on this note.

## 2018-12-07 NOTE — Telephone Encounter (Addendum)
Fraser Din, I spoke with the pts wife Hassan Rowan, and she wants you to give her a call back when you return to the office, to help assist her with getting the pts paperwork and forms completed.  The pts wife Hassan Rowan is requesting that you call her on her cell phone at [phone]. This is in regards paperwork for disability, you guys have been working on.  Informed Hassan Rowan I would route this message to you for further follow-up, upon return to the office. Hassan Rowan verbalized understanding and agreed with this plan.

## 2018-12-08 NOTE — Telephone Encounter (Signed)
Left message to call back  

## 2018-12-08 NOTE — Telephone Encounter (Signed)
Patient's wife returned your call

## 2018-12-08 NOTE — Telephone Encounter (Signed)
I spoke with pt's wife.  They have completed their part of paperwork in an email and need Dr. Angelena Form to sign.  They do not have a printer.  She will check with her grand daughter and see if she can print paperwork and then scan in and send through my chart.

## 2018-12-20 DIAGNOSIS — R351 Nocturia: Secondary | ICD-10-CM | POA: Diagnosis not present

## 2018-12-20 DIAGNOSIS — I714 Abdominal aortic aneurysm, without rupture: Secondary | ICD-10-CM | POA: Diagnosis not present

## 2018-12-20 DIAGNOSIS — I10 Essential (primary) hypertension: Secondary | ICD-10-CM | POA: Diagnosis not present

## 2018-12-20 DIAGNOSIS — Z0000 Encounter for general adult medical examination without abnormal findings: Secondary | ICD-10-CM | POA: Diagnosis not present

## 2018-12-20 DIAGNOSIS — R413 Other amnesia: Secondary | ICD-10-CM | POA: Diagnosis not present

## 2018-12-20 DIAGNOSIS — Z1321 Encounter for screening for nutritional disorder: Secondary | ICD-10-CM | POA: Diagnosis not present

## 2018-12-20 DIAGNOSIS — Z7901 Long term (current) use of anticoagulants: Secondary | ICD-10-CM | POA: Diagnosis not present

## 2018-12-20 DIAGNOSIS — N401 Enlarged prostate with lower urinary tract symptoms: Secondary | ICD-10-CM | POA: Diagnosis not present

## 2018-12-20 DIAGNOSIS — Z7689 Persons encountering health services in other specified circumstances: Secondary | ICD-10-CM | POA: Diagnosis not present

## 2018-12-20 DIAGNOSIS — E785 Hyperlipidemia, unspecified: Secondary | ICD-10-CM | POA: Diagnosis not present

## 2018-12-20 DIAGNOSIS — K449 Diaphragmatic hernia without obstruction or gangrene: Secondary | ICD-10-CM | POA: Diagnosis not present

## 2018-12-27 NOTE — Telephone Encounter (Signed)
Follow up   Patient is calling per the previous message to see if paperwork was forwarded back to originator via email.

## 2018-12-27 NOTE — Telephone Encounter (Signed)
Rerouted to [facility]

## 2018-12-28 ENCOUNTER — Telehealth: Payer: Self-pay | Admitting: Cardiovascular Disease

## 2018-12-28 DIAGNOSIS — K449 Diaphragmatic hernia without obstruction or gangrene: Secondary | ICD-10-CM | POA: Diagnosis not present

## 2018-12-28 DIAGNOSIS — I714 Abdominal aortic aneurysm, without rupture: Secondary | ICD-10-CM | POA: Diagnosis not present

## 2018-12-28 DIAGNOSIS — R413 Other amnesia: Secondary | ICD-10-CM | POA: Diagnosis not present

## 2018-12-28 DIAGNOSIS — Z1321 Encounter for screening for nutritional disorder: Secondary | ICD-10-CM | POA: Diagnosis not present

## 2018-12-28 DIAGNOSIS — E785 Hyperlipidemia, unspecified: Secondary | ICD-10-CM | POA: Diagnosis not present

## 2018-12-28 DIAGNOSIS — R351 Nocturia: Secondary | ICD-10-CM | POA: Diagnosis not present

## 2018-12-28 DIAGNOSIS — I10 Essential (primary) hypertension: Secondary | ICD-10-CM | POA: Diagnosis not present

## 2018-12-28 DIAGNOSIS — Z7901 Long term (current) use of anticoagulants: Secondary | ICD-10-CM | POA: Diagnosis not present

## 2018-12-28 DIAGNOSIS — R7989 Other specified abnormal findings of blood chemistry: Secondary | ICD-10-CM | POA: Diagnosis not present

## 2018-12-28 DIAGNOSIS — N401 Enlarged prostate with lower urinary tract symptoms: Secondary | ICD-10-CM | POA: Diagnosis not present

## 2018-12-28 NOTE — Telephone Encounter (Signed)
I spoke with pt and let him know Dr. Angelena Form had completed the paperwork and sent him a my chart message on June 25,2020

## 2018-12-28 NOTE — Telephone Encounter (Signed)
I spoke with pt's wife and let her know Dr. Angelena Form had completed forms on 6/25 and submitted on line

## 2018-12-28 NOTE — Telephone Encounter (Signed)
New Message   Patients wife is returning call in reference to disability paperwork. Please call to discuss.

## 2019-01-10 DIAGNOSIS — E538 Deficiency of other specified B group vitamins: Secondary | ICD-10-CM | POA: Diagnosis not present

## 2019-02-01 DIAGNOSIS — R413 Other amnesia: Secondary | ICD-10-CM | POA: Diagnosis not present

## 2019-02-01 DIAGNOSIS — E538 Deficiency of other specified B group vitamins: Secondary | ICD-10-CM | POA: Diagnosis not present

## 2019-02-07 DIAGNOSIS — E538 Deficiency of other specified B group vitamins: Secondary | ICD-10-CM | POA: Diagnosis not present

## 2019-02-07 DIAGNOSIS — R413 Other amnesia: Secondary | ICD-10-CM | POA: Diagnosis not present

## 2019-02-14 DIAGNOSIS — E538 Deficiency of other specified B group vitamins: Secondary | ICD-10-CM | POA: Diagnosis not present

## 2019-02-14 DIAGNOSIS — R413 Other amnesia: Secondary | ICD-10-CM | POA: Diagnosis not present

## 2019-03-22 DIAGNOSIS — E538 Deficiency of other specified B group vitamins: Secondary | ICD-10-CM | POA: Diagnosis not present

## 2019-03-22 DIAGNOSIS — R413 Other amnesia: Secondary | ICD-10-CM | POA: Diagnosis not present

## 2019-07-18 ENCOUNTER — Other Ambulatory Visit: Payer: Self-pay | Admitting: *Deleted

## 2019-07-18 DIAGNOSIS — I712 Thoracic aortic aneurysm, without rupture, unspecified: Secondary | ICD-10-CM

## 2019-08-24 ENCOUNTER — Ambulatory Visit: Payer: PPO | Admitting: Surgery

## 2019-08-24 ENCOUNTER — Other Ambulatory Visit: Payer: Self-pay

## 2019-08-24 ENCOUNTER — Encounter: Payer: Self-pay | Admitting: Surgery

## 2019-08-24 ENCOUNTER — Ambulatory Visit
Admission: RE | Admit: 2019-08-24 | Discharge: 2019-08-24 | Disposition: A | Discharge status: Home / Self Care | Payer: PPO | Source: Ambulatory Visit | Attending: Surgery | Admitting: Surgery

## 2019-08-24 VITALS — BP 115/62 | HR 57 | Temp 97.0°F | Resp 16 | Ht 67.0 in | Wt 174.4 lb

## 2019-08-24 DIAGNOSIS — I712 Thoracic aortic aneurysm, without rupture, unspecified: Secondary | ICD-10-CM

## 2019-08-24 MED ORDER — IOPAMIDOL (ISOVUE-370) INJECTION 76%
75.0000 mL | Freq: Once | INTRAVENOUS | Status: AC | PRN
  Administered 2019-08-24: 75 mL via INTRAVENOUS

## 2019-08-24 NOTE — Progress Notes (Signed)
HPI:  The patient is an 79 year old gentleman with a 4.5 cm fusiform ascending aortic aneurysm which has been fairly stable dating back to 2012 when it was measured at 4.2 cm.  It was measured at 4.0 cm in 2004.  Since I last saw him he has been feeling well.  He denies any chest or back pain.  Current Outpatient Medications  Medication Sig Dispense Refill  . atorvastatin (LIPITOR) 80 MG tablet Take 1 tablet (80 mg total) by mouth daily. 90 tablet 3  . clopidogrel (PLAVIX) 75 MG tablet Take 1 tablet (75 mg total) by mouth daily. 90 tablet 3  . metoprolol succinate (TOPROL XL) 25 MG 24 hr tablet Take 1 tablet (25 mg total) by mouth daily. 90 tablet 3  . pantoprazole (PROTONIX) 40 MG tablet Take 40 mg by mouth daily.     No current facility-administered medications for this visit.     Physical Exam: BP 115/62 (BP Location: Right Arm, Patient Position: Sitting, Cuff Size: Normal)   Pulse (!) 57   Temp (!) 97 F (36.1 C)   Resp 16   Ht 5\' 7"  (1.702 m)   Wt 174 lb 6.4 oz (79.1 kg)   SpO2 98% Comment: RA  BMI 27.31 kg/m  He looks well. Cardiac exam shows a regular rate and rhythm with normal heart sounds.  There is no murmur. Lungs are clear.  Diagnostic Tests:  CLINICAL DATA:  Ascending thoracic aortic prominence  EXAM: CT ANGIOGRAPHY CHEST WITH CONTRAST  TECHNIQUE: Multidetector CT imaging of the chest was performed using the standard protocol during bolus administration of intravenous contrast. Multiplanar CT image reconstructions and MIPs were obtained to evaluate the vascular anatomy.  CONTRAST:  36mL ISOVUE-370 IOPAMIDOL (ISOVUE-370) INJECTION 76%  COMPARISON:  February 20, 2019  FINDINGS: Cardiovascular: Ascending thoracic aortic diameter measures 4.5 x 4.5 cm, stable. Measured diameter at the sinuses of Valsalva is 4.0 cm. Measured diameter at the sinotubular junction is 3.5 cm. Diameter at the aortic arch measures 3.6 cm. Diameter of the descending  thoracic aorta at the main pulmonary outflow tract level measures 2.8 x 2.8 cm. There is no evident thoracic aortic dissection. There are scattered foci of calcification in visualized great vessels. There are foci of aortic atherosclerosis. There also foci of coronary artery calcification, particularly in the left anterior descending coronary artery region. No pericardial effusion or pericardial thickening. No demonstrable pulmonary embolus.  Mediastinum/Nodes: Thyroid appears unremarkable. No evident thoracic adenopathy. There are no appreciable esophageal lesions.  Lungs/Pleura: There is bibasilar atelectatic change. No edema or airspace opacity. No pleural effusions are evident.  Upper Abdomen: There is hepatic steatosis. There is a 6 mm probable cyst in the left lobe of the liver. There is upper abdominal aortic atherosclerosis with atherosclerotic calcification in visualized proximal major mesenteric arterial vessels. Visualized upper abdominal structures otherwise appear unremarkable.  Musculoskeletal: There is degenerative change in the thoracic spine. No blastic or lytic bone lesions are evident. There are no chest wall lesions. Note that there is chronic elevation of the right hemidiaphragm.  Review of the MIP images confirms the above findings.  IMPRESSION: 1. Stable diameter of the ascending thoracic aorta measuring 4.5 x 4.5 cm. Other measurements in the aorta as described. No dissection. There is aortic atherosclerosis as well as foci of great vessel and coronary artery calcification. Ascending thoracic aortic aneurysm. Recommend semi-annual imaging followup by CTA or MRA and referral to cardiothoracic surgery if not already obtained. This recommendation follows 2010  ACCF/AHA/AATS/ACR/ASA/SCA/SCAI/SIR/STS/SVM Guidelines for the Diagnosis and Management of Patients With Thoracic Aortic Disease. Circulation. 2010; 121ML:[id number]. Aortic aneurysm  NOS (ICD10-I71.9).  2.  No evident pulmonary embolus.  3.  Scattered areas of atelectasis.  No edema or airspace opacity.  4.  No adenopathy.  5.  Hepatic steatosis.  Aortic Atherosclerosis (ICD10-I70.0).   Electronically Signed   By: Lowella Grip III M.D.   On: 08/24/2019 13:17  Impression:  This 79 year old gentleman has a stable 4.5 cm fusiform ascending aortic aneurysm which is changed minimally since 2012.  This is still well below the surgical threshold of 5.5 cm.  I reviewed the images with him and his granddaughter and answered their questions.  I stressed the importance of good blood pressure control and preventing further enlargement and acute aortic dissection.  Plan:   I will see him back in 1 year with a CTA of the chest.  I spent 20 minutes performing this established patient evaluation and > 50% of this time was spent face to face counseling and coordinating the care of this patient's aortic aneurysm.   Gaye Pollack, MD [facility] [phone]

## 2019-08-27 ENCOUNTER — Other Ambulatory Visit: Payer: Self-pay | Admitting: Physician Assistant

## 2019-08-29 ENCOUNTER — Other Ambulatory Visit: Payer: Self-pay | Admitting: Physician Assistant

## 2019-09-27 DIAGNOSIS — E785 Hyperlipidemia, unspecified: Secondary | ICD-10-CM | POA: Diagnosis not present

## 2019-09-27 DIAGNOSIS — E538 Deficiency of other specified B group vitamins: Secondary | ICD-10-CM | POA: Diagnosis not present

## 2019-09-27 DIAGNOSIS — R413 Other amnesia: Secondary | ICD-10-CM | POA: Diagnosis not present

## 2019-09-27 DIAGNOSIS — Z9889 Other specified postprocedural states: Secondary | ICD-10-CM | POA: Diagnosis not present

## 2019-09-27 DIAGNOSIS — I10 Essential (primary) hypertension: Secondary | ICD-10-CM | POA: Diagnosis not present

## 2019-09-27 DIAGNOSIS — K449 Diaphragmatic hernia without obstruction or gangrene: Secondary | ICD-10-CM | POA: Diagnosis not present

## 2019-09-27 DIAGNOSIS — Z7901 Long term (current) use of anticoagulants: Secondary | ICD-10-CM | POA: Diagnosis not present

## 2019-10-04 DIAGNOSIS — E538 Deficiency of other specified B group vitamins: Secondary | ICD-10-CM | POA: Diagnosis not present

## 2019-10-19 DIAGNOSIS — E538 Deficiency of other specified B group vitamins: Secondary | ICD-10-CM | POA: Diagnosis not present

## 2019-11-04 DIAGNOSIS — E538 Deficiency of other specified B group vitamins: Secondary | ICD-10-CM | POA: Diagnosis not present

## 2019-12-05 DIAGNOSIS — E538 Deficiency of other specified B group vitamins: Secondary | ICD-10-CM | POA: Diagnosis not present

## 2020-07-18 ENCOUNTER — Other Ambulatory Visit: Payer: Self-pay | Admitting: *Deleted

## 2020-07-18 DIAGNOSIS — I712 Thoracic aortic aneurysm, without rupture, unspecified: Secondary | ICD-10-CM

## 2020-08-22 ENCOUNTER — Other Ambulatory Visit: Payer: PPO

## 2020-08-22 ENCOUNTER — Encounter: Payer: PPO | Admitting: Surgery

## 2020-12-20 ENCOUNTER — Encounter (HOSPITAL_COMMUNITY): Payer: Self-pay | Admitting: Emergency Medicine

## 2020-12-20 ENCOUNTER — Emergency Department (HOSPITAL_COMMUNITY)
Admission: EM | Admit: 2020-12-20 | Discharge: 2020-12-20 | Disposition: A | Discharge status: Home / Self Care | Payer: Medicare Other | Attending: Emergency Medicine | Admitting: Emergency Medicine

## 2020-12-20 ENCOUNTER — Emergency Department (HOSPITAL_COMMUNITY): Payer: Medicare Other

## 2020-12-20 DIAGNOSIS — N200 Calculus of kidney: Secondary | ICD-10-CM | POA: Diagnosis not present

## 2020-12-20 DIAGNOSIS — I5032 Chronic diastolic (congestive) heart failure: Secondary | ICD-10-CM | POA: Insufficient documentation

## 2020-12-20 DIAGNOSIS — Z7902 Long term (current) use of antithrombotics/antiplatelets: Secondary | ICD-10-CM | POA: Insufficient documentation

## 2020-12-20 DIAGNOSIS — I110 Hypertensive heart disease with heart failure: Secondary | ICD-10-CM | POA: Diagnosis not present

## 2020-12-20 DIAGNOSIS — I2510 Atherosclerotic heart disease of native coronary artery without angina pectoris: Secondary | ICD-10-CM | POA: Insufficient documentation

## 2020-12-20 DIAGNOSIS — Z79899 Other long term (current) drug therapy: Secondary | ICD-10-CM | POA: Insufficient documentation

## 2020-12-20 DIAGNOSIS — Z87891 Personal history of nicotine dependence: Secondary | ICD-10-CM | POA: Diagnosis not present

## 2020-12-20 DIAGNOSIS — R109 Unspecified abdominal pain: Secondary | ICD-10-CM | POA: Diagnosis present

## 2020-12-20 LAB — COMPREHENSIVE METABOLIC PANEL
ALT: 12 U/L (ref 0–44)
AST: 20 U/L (ref 15–41)
Albumin: 3.6 g/dL (ref 3.5–5.0)
Alkaline Phosphatase: 75 U/L (ref 38–126)
Anion gap: 9 (ref 5–15)
BUN: 18 mg/dL (ref 8–23)
CO2: 24 mmol/L (ref 22–32)
Calcium: 8.8 mg/dL — ABNORMAL LOW (ref 8.9–10.3)
Chloride: 105 mmol/L (ref 98–111)
Creatinine, Ser: 1.82 mg/dL — ABNORMAL HIGH (ref 0.61–1.24)
GFR, Estimated: 37 mL/min — ABNORMAL LOW (ref >60)
Glucose, Bld: 102 mg/dL — ABNORMAL HIGH (ref 70–99)
Potassium: 4.3 mmol/L (ref 3.5–5.1)
Sodium: 138 mmol/L (ref 135–145)
Total Bilirubin: 1.6 mg/dL — ABNORMAL HIGH (ref 0.3–1.2)
Total Protein: 6.3 g/dL — ABNORMAL LOW (ref 6.5–8.1)

## 2020-12-20 LAB — CBC WITH DIFFERENTIAL/PLATELET
Abs Immature Granulocytes: 0.03 10*3/uL (ref 0.00–0.07)
Basophils Absolute: 0.1 10*3/uL (ref 0.0–0.1)
Basophils Relative: 1 %
Eosinophils Absolute: 0.2 10*3/uL (ref 0.0–0.5)
Eosinophils Relative: 3 %
HCT: 44.7 % (ref 39.0–52.0)
Hemoglobin: 14.6 g/dL (ref 13.0–17.0)
Immature Granulocytes: 0 %
Lymphocytes Relative: 18 %
Lymphs Abs: 1.5 10*3/uL (ref 0.7–4.0)
MCH: 30.7 pg (ref 26.0–34.0)
MCHC: 32.7 g/dL (ref 30.0–36.0)
MCV: 94.1 fL (ref 80.0–100.0)
Monocytes Absolute: 0.9 10*3/uL (ref 0.1–1.0)
Monocytes Relative: 11 %
Neutro Abs: 5.7 10*3/uL (ref 1.7–7.7)
Neutrophils Relative %: 67 %
Platelets: 218 10*3/uL (ref 150–400)
RBC: 4.75 MIL/uL (ref 4.22–5.81)
RDW: 13.3 % (ref 11.5–15.5)
WBC: 8.5 10*3/uL (ref 4.0–10.5)
nRBC: 0 % (ref 0.0–0.2)

## 2020-12-20 LAB — URINALYSIS, ROUTINE W REFLEX MICROSCOPIC
Bacteria, UA: NONE SEEN
Bilirubin Urine: NEGATIVE
Glucose, UA: NEGATIVE mg/dL
Ketones, ur: 5 mg/dL — AB
Nitrite: NEGATIVE
Protein, ur: NEGATIVE mg/dL
RBC / HPF: >50 RBC/hpf — ABNORMAL HIGH (ref 0–5)
Specific Gravity, Urine: 1.021 (ref 1.005–1.030)
pH: 5 (ref 5.0–8.0)

## 2020-12-20 MED ORDER — ONDANSETRON 4 MG PO TBDP: 4.0000 mg | ORAL_TABLET | Freq: Three times a day (TID) | ORAL | 0 refills | Status: DC | PRN

## 2020-12-20 MED ORDER — ONDANSETRON HCL 4 MG/2ML IJ SOLN
4.0000 mg | Freq: Once | INTRAMUSCULAR | Status: AC
  Administered 2020-12-20: 4 mg via INTRAVENOUS
  Filled 2020-12-20: qty 2

## 2020-12-20 MED ORDER — OXYCODONE HCL 5 MG PO TABS: 5.0000 mg | ORAL_TABLET | Freq: Four times a day (QID) | ORAL | 0 refills | Status: DC | PRN

## 2020-12-20 MED ORDER — MORPHINE SULFATE (PF) 4 MG/ML IV SOLN
4.0000 mg | Freq: Once | INTRAVENOUS | Status: AC
  Administered 2020-12-20: 4 mg via INTRAVENOUS
  Filled 2020-12-20: qty 1

## 2020-12-20 MED ORDER — LACTATED RINGERS IV BOLUS
1000.0000 mL | Freq: Once | INTRAVENOUS | Status: AC
  Administered 2020-12-20: 1000 mL via INTRAVENOUS

## 2020-12-20 NOTE — Discharge Instructions (Addendum)
[name] was evaluated in our ER for his kidney stones. We did a CT scan that showed the stone moved a little bit lower in his left kidney. There was no evidence of infection on our work-up. Therefore, we feel that he is stable for discharge home. Follow up with PCP in next couple days. Use the number above to call our urologist to set up an appointment. Please return for any worsening symptoms, including fevers, chills, vomiting that won't stop.

## 2020-12-20 NOTE — ED Triage Notes (Signed)
Patient here with complaint of kidney stone, states a left 75mm stone was found last Saturday at [facility]. Patient has been unable to pass stone and is still hurting. Patient alert, oriented, and in no apparent distress at this time.

## 2020-12-20 NOTE — ED Provider Notes (Signed)
Emergency Medicine Provider Triage Evaluation Note  JERAME HEDDING , a 80 y.o. male  was evaluated in triage.  Pt complains of left-sided flank pain.  Few days ago was told that he had a 6 mm stone that was not going to pass on its own.  He has not been scheduled for procedure yet.  Continues to have pain despite taking oxycodone and Flomax.  Denies any fevers or vomiting.  History is provided by visitor at the bedside.  Review of Systems  Positive: Flank pain Negative: Fever, vomiting  Physical Exam  BP (!) 119/95   Pulse 77   Temp 98.5 F (36.9 C) (Oral)   Resp 14   SpO2 96%  Gen:   Awake, no distress   Resp:  Normal effort  MSK:   Moves extremities without difficulty  Other:  No acute distress, no signs of respiratory distress  Medical Decision Making  Medically screening exam initiated at 1:44 PM.  Appropriate orders placed.  TYSHAUN VINZANT was informed that the remainder of the evaluation will be completed by another provider, this initial triage assessment does not replace that evaluation, and the importance of remaining in the ED until their evaluation is complete.  Will order lab work and urinalysis   Delia Heady, PA-C 12/20/20 1345    Daleen Bo, MD 12/22/20 1335

## 2021-01-11 NOTE — ED Provider Notes (Signed)
[facility] EMERGENCY DEPARTMENT Provider Note   CSN: [id number] Arrival date & time: 12/20/20  1316     History Chief Complaint  Patient presents with   Flank Pain    Martin [name] is a 80 y.o. male.   Flank Pain  79 year old male PMHx thoracic aortic aneurysm, CAD, HTN, HLD, aortic root dilation, presenting for left-sided flank pain.  He was recently seen at [facility], where he was advised that he had a 6 mm stone.  Pain is at times severe, radiates from the left flank anteriorly, waxing and waning, worsening, no significant improvement despite taking oxycodone and Flomax.  Associated symptoms include nausea, darkened urine.  No other significant associated symptoms, including fevers, chills, vomiting, chest pain, shortness of breath, bowel changes, dysuria.  History obtained from granddaughter, patient, chart review.  Past Medical History:  Diagnosis Date   Ascending aortic aneurysm (HCC)    BPH (benign prostatic hyperplasia)    CAD (coronary artery disease)    a. DES to ostial-prox LAD 2011. b. Last cath 02/2014 showed 20% distal LM, 40% OM1, 50% mid-distal AV groove Cx, 70% distal RCA, diffusely diseased PDA, EF 50%    Chronic diastolic CHF (congestive heart failure) (HCC)    HLD (hyperlipidemia)    PVC's (premature ventricular contractions)     Patient Active Problem List   Diagnosis Date Noted   Cellulitis 03/06/2014   Dyspnea 03/25/2013   Thoracic aortic aneurysm ([place]) 04/07/2011   Erectile dysfunction 04/07/2011   CAD (coronary artery disease)    HLD (hyperlipidemia)    Dilated aortic root ([place])     Past Surgical History:  Procedure Laterality Date   CARDIAC CATHETERIZATION  02/2010   20% left main, 80% ostial/proximal LAD, 60% distal RCA leading into a small PDA which is occluded distally   CORONARY ANGIOPLASTY  02/2010   LAD: 3.0 X 28 mm Promus DES   LEFT HEART CATHETERIZATION WITH CORONARY ANGIOGRAM N/A 02/18/2013   Procedure: LEFT  HEART CATHETERIZATION WITH CORONARY ANGIOGRAM;  Surgeon: [name], MD;  Location: [facility] CATH LAB;  Service: Cardiovascular;  Laterality: N/A;       Family History  Problem Relation Age of Onset   Ovarian cancer Mother    Heart disease Father    Heart attack Father    Bone cancer Brother    Hypertension Neg Hx    Stroke Neg Hx     Social History   Tobacco Use   Smoking status: Former    Packs/day: 3.00    Years: 50.00    Pack years: 150.00    Types: Cigarettes    Quit date: 03/04/2010    Years since quitting: 10.8   Smokeless tobacco: Never  Substance Use Topics   Alcohol use: Yes    Comment: beer on weekend   Drug use: No    Home Medications Prior to Admission medications   Medication Sig Start Date End Date Taking? Authorizing Provider  ondansetron (ZOFRAN ODT) 4 MG disintegrating tablet Take 1 tablet (4 mg total) by mouth every 8 (eight) hours as needed for up to 10 doses for nausea or vomiting. 12/20/20  Yes Rahkim Rabalais, MD  oxyCODONE (ROXICODONE) 5 MG immediate release tablet Take 1 tablet (5 mg total) by mouth every 6 (six) hours as needed for up to 10 doses for severe pain. 12/20/20  Yes Caroline Matters, MD  atorvastatin (LIPITOR) 80 MG tablet Take 1 tablet (80 mg total) by mouth daily at 6 PM. Please make  overdue appt with Dr. Angelena Form before anymore refills. 1st attempt 08/29/19   Charlie Pitter, PA-C  clopidogrel (PLAVIX) 75 MG tablet Take 1 tablet (75 mg total) by mouth daily. Please make overdue appt with Dr. Angelena Form before anymore refills. 1st attempt 08/29/19   Charlie Pitter, PA-C  metoprolol succinate (TOPROL-XL) 25 MG 24 hr tablet Take 1 tablet (25 mg total) by mouth daily. Please make overdue appt with Dr. Angelena Form before anymore refills. 1st attempt 08/29/19   Charlie Pitter, PA-C  pantoprazole (PROTONIX) 40 MG tablet Take 40 mg by mouth daily.    [provider]    Allergies    Patient has no known allergies.  Review of  Systems   Review of Systems  Genitourinary:  Positive for flank pain.  All other systems reviewed and are negative.  Physical Exam Updated Vital Signs BP (!) 142/97   Pulse 60   Temp 98.2 F (36.8 C) (Oral)   Resp 16   SpO2 97%   Physical Exam Vitals and nursing note reviewed.  Constitutional:      General: He is not in acute distress.    Appearance: He is not toxic-appearing.  HENT:     Head: Normocephalic and atraumatic.  Eyes:     Extraocular Movements: Extraocular movements intact.     Conjunctiva/sclera: Conjunctivae normal.  Cardiovascular:     Rate and Rhythm: Normal rate and regular rhythm.  Pulmonary:     Effort: Pulmonary effort is normal.     Breath sounds: Normal breath sounds.  Abdominal:     General: There is no distension.     Tenderness: There is no abdominal tenderness. There is left CVA tenderness (mild). There is no right CVA tenderness, guarding or rebound.  Musculoskeletal:        General: No deformity or signs of injury.     Cervical back: Normal range of motion. No rigidity.  Skin:    General: Skin is warm and dry.  Neurological:     General: No focal deficit present.     Mental Status: He is alert. Mental status is at baseline.  Psychiatric:        Mood and Affect: Mood normal.        Behavior: Behavior normal.    ED Results / Procedures / Treatments   Labs (all labs ordered are listed, but only abnormal results are displayed) Labs Reviewed  COMPREHENSIVE METABOLIC PANEL - Abnormal; Notable for the following components:      Result Value   Glucose, Bld 102 (*)    Creatinine, Ser 1.82 (*)    Calcium 8.8 (*)    Total Protein 6.3 (*)    Total Bilirubin 1.6 (*)    GFR, Estimated 37 (*)    All other components within normal limits  URINALYSIS, ROUTINE W REFLEX MICROSCOPIC - Abnormal; Notable for the following components:   APPearance HAZY (*)    Hgb urine dipstick LARGE (*)    Ketones, ur 5 (*)    Leukocytes,Ua TRACE (*)    RBC / HPF  >50 (*)    All other components within normal limits  CBC WITH DIFFERENTIAL/PLATELET    EKG None  Radiology No results found.  Procedures Procedures   Medications Ordered in ED Medications  morphine 4 MG/ML injection 4 mg (4 mg Intravenous Given 12/20/20 2058)  lactated ringers bolus 1,000 mL (0 mLs Intravenous Stopped 12/20/20 2213)  ondansetron (ZOFRAN) injection 4 mg (4 mg Intravenous Given 12/20/20 2059)  ED Course  I have reviewed the triage vital signs and the nursing notes.  Pertinent labs & imaging results that were available during my care of the patient were reviewed by me and considered in my medical decision making (see chart for details).    MDM Rules/Calculators/A&P                         This is an 80 year old male PMHx cognitive deficits, CAD, HTN, HLD, aortic root dissection, recently diagnosed left 6 mm nephrolith, presenting for worsening pain.  On exam, mild left CVA tenderness, otherwise AF with VSS and benign exam.  Initial interventions: Morphine 4 mg for pain, LR 1 L for hydration, Zofran 4 mg for nausea, with improvement  All studies independently reviewed by myself, d/w the attending physician, factored into my MDM. -CT stone study: Similar mild left hydronephrosis and hydroureter 2/two 4 x 6 mm stone in proximal mid left ureter, slight distal migration compared to prior -CMP: Creatinine 1.82 -UA: Large amount of WBCs, trace leukocytes, nitrate negative -Unremarkable: CBCd  Presentation most consistent with left nephrolith with some distal progression.  No signs or symptoms concerning for infection, afebrile, urinalysis with scant leukocytes and nitrite negative.  No evidence of worsened hydronephrosis on CT imaging.  Patient is HDS, not in extremis, benign serial abdominal exams.  Therefore, feel that he is stable for discharge home with close outpatient follow-up, including urologic follow-up.  We will send home with Zofran and Roxicodone for  symptomatic relief in the meantime.  Return precautions discussed.  Patient/family understand and agree.  Patient HDS on reevaluation, subsequently discharged.  Final Clinical Impression(s) / ED Diagnoses Final diagnoses:  Left nephrolithiasis    Rx / DC Orders ED Discharge Orders          Ordered    oxyCODONE (ROXICODONE) 5 MG immediate release tablet  Every 6 hours PRN        12/20/20 2218    ondansetron (ZOFRAN ODT) 4 MG disintegrating tablet  Every 8 hours PRN        12/20/20 2218             Levin Bacon, MD 01/11/21 0505    Noemi Chapel, MD 01/18/21 [phone]

## 2023-05-21 ENCOUNTER — Encounter (HOSPITAL_BASED_OUTPATIENT_CLINIC_OR_DEPARTMENT_OTHER): Payer: Self-pay | Admitting: Emergency Medicine

## 2023-05-21 ENCOUNTER — Ambulatory Visit (HOSPITAL_BASED_OUTPATIENT_CLINIC_OR_DEPARTMENT_OTHER)
Admission: EM | Admit: 2023-05-21 | Discharge: 2023-05-21 | Disposition: A | Discharge status: Home / Self Care | Payer: Medicare Other | Attending: Internal Medicine | Admitting: Internal Medicine

## 2023-05-21 DIAGNOSIS — R269 Unspecified abnormalities of gait and mobility: Secondary | ICD-10-CM | POA: Diagnosis not present

## 2023-05-21 DIAGNOSIS — H9319 Tinnitus, unspecified ear: Secondary | ICD-10-CM

## 2023-05-21 NOTE — Discharge Instructions (Addendum)
You need to schedule an appointment with your primary care provider for further evaluation and treatment of your sense of off balance.  I would recommend you see an ear nose and throat doctor regarding the ringing in your ears. Use a walker for assisted ambulation.  Local ENT Dr.Brandon Ma Address: [address] [place], [place], [place] [address] Phone: [phone]

## 2023-05-21 NOTE — ED Triage Notes (Signed)
Pt reports has ringing in his ears for couple years. Reports that balance has been off for a few months.

## 2023-05-21 NOTE — ED Provider Notes (Signed)
[facility]    CSN: [id number] Arrival date & time: 05/21/23  0913      History   Chief Complaint Chief Complaint  Patient presents with   Tinnitus   Dizziness    HPI Martin [name] is a 82 y.o. male.    Dizziness Associated symptoms: no chest pain, no nausea, no shortness of breath and no vomiting   Ringing in both ears for years.  Also complains of difficulty walking due to a sense of feeling off balance for months. Denies headache, change in vision, paresthesias, weakness, recent injury, recent fall, change in medications, chest pain, shortness of breath, nausea, sense of spinning.  Past Medical History:  Diagnosis Date   Ascending aortic aneurysm (HCC)    BPH (benign prostatic hyperplasia)    CAD (coronary artery disease)    a. DES to ostial-prox LAD 2011. b. Last cath 02/2014 showed 20% distal LM, 40% OM1, 50% mid-distal AV groove Cx, 70% distal RCA, diffusely diseased PDA, EF 50%    Chronic diastolic CHF (congestive heart failure) (HCC)    HLD (hyperlipidemia)    PVC's (premature ventricular contractions)     Patient Active Problem List   Diagnosis Date Noted   Cellulitis 03/06/2014   Dyspnea 03/25/2013   Thoracic aortic aneurysm (HCC) 04/07/2011   Erectile dysfunction 04/07/2011   CAD (coronary artery disease)    HLD (hyperlipidemia)    Dilated aortic root (HCC)     Past Surgical History:  Procedure Laterality Date   CARDIAC CATHETERIZATION  02/2010   20% left main, 80% ostial/proximal LAD, 60% distal RCA leading into a small PDA which is occluded distally   CORONARY ANGIOPLASTY  02/2010   LAD: 3.0 X 28 mm Promus DES   LEFT HEART CATHETERIZATION WITH CORONARY ANGIOGRAM N/A 02/18/2013   Procedure: LEFT HEART CATHETERIZATION WITH CORONARY ANGIOGRAM;  Surgeon: Kathleene Hazel, MD;  Location: [facility] CATH LAB;  Service: Cardiovascular;  Laterality: N/A;       Home Medications    Prior to Admission medications   Medication Sig Start Date  End Date Taking? Authorizing Provider  atorvastatin (LIPITOR) 80 MG tablet Take 1 tablet (80 mg total) by mouth daily at 6 PM. Please make overdue appt with Dr. Clifton James before anymore refills. 1st attempt 08/29/19   Laurann Montana, PA-C  clopidogrel (PLAVIX) 75 MG tablet Take 1 tablet (75 mg total) by mouth daily. Please make overdue appt with Dr. Clifton James before anymore refills. 1st attempt 08/29/19   Laurann Montana, PA-C  metoprolol succinate (TOPROL-XL) 25 MG 24 hr tablet Take 1 tablet (25 mg total) by mouth daily. Please make overdue appt with Dr. Clifton James before anymore refills. 1st attempt 08/29/19   Laurann Montana, PA-C  ondansetron (ZOFRAN ODT) 4 MG disintegrating tablet Take 1 tablet (4 mg total) by mouth every 8 (eight) hours as needed for up to 10 doses for nausea or vomiting. 12/20/20   Colvin Caroli, MD  oxyCODONE (ROXICODONE) 5 MG immediate release tablet Take 1 tablet (5 mg total) by mouth every 6 (six) hours as needed for up to 10 doses for severe pain. 12/20/20   Colvin Caroli, MD  pantoprazole (PROTONIX) 40 MG tablet Take 40 mg by mouth daily.    [provider]    Family History Family History  Problem Relation Age of Onset   Ovarian cancer Mother    Heart disease Father    Heart attack Father    Bone cancer Brother    Hypertension  Neg Hx    Stroke Neg Hx     Social History Social History   Tobacco Use   Smoking status: Former    Current packs/day: 0.00    Average packs/day: 3.0 packs/day for 50.0 years (150.0 ttl pk-yrs)    Types: Cigarettes    Start date: 03/04/1960    Quit date: 03/04/2010    Years since quitting: 13.2   Smokeless tobacco: Never  Substance Use Topics   Alcohol use: Yes    Comment: beer on weekend   Drug use: No     Allergies   Patient has no known allergies.   Review of Systems Review of Systems  Constitutional:  Negative for diaphoresis.  HENT:  Negative for congestion, ear pain, rhinorrhea and sore throat.   Eyes:   Negative for visual disturbance.  Respiratory:  Negative for shortness of breath.   Cardiovascular:  Negative for chest pain.  Gastrointestinal:  Negative for abdominal pain, nausea and vomiting.  Neurological:  Positive for dizziness. Negative for tremors, speech difficulty, light-headedness and numbness.     Physical Exam Triage Vital Signs ED Triage Vitals  Encounter Vitals Group     BP 05/21/23 0926 108/72     Systolic BP Percentile --      Diastolic BP Percentile --      Pulse Rate 05/21/23 0926 60     Resp 05/21/23 0926 20     Temp 05/21/23 0926 (!) 97.4 F (36.3 C)     Temp Source 05/21/23 0926 Oral     SpO2 05/21/23 0926 96 %     Weight --      Height --      Head Circumference --      Peak Flow --      Pain Score 05/21/23 0924 0     Pain Loc --      Pain Education --      Exclude from Growth Chart --    No data found.  Updated Vital Signs BP 108/72 (BP Location: Right Arm)   Pulse 60   Temp (!) 97.4 F (36.3 C) (Oral)   Resp 20   SpO2 96%   Visual Acuity Right Eye Distance:   Left Eye Distance:   Bilateral Distance:    Right Eye Near:   Left Eye Near:    Bilateral Near:     Physical Exam Vitals and nursing note reviewed.  Constitutional:      Appearance: He is not ill-appearing.  HENT:     Head: Normocephalic and atraumatic.     Right Ear: Tympanic membrane and ear canal normal.     Left Ear: Tympanic membrane and ear canal normal.     Ears:     Comments: Hard of hearing    Mouth/Throat:     Mouth: Mucous membranes are moist.  Eyes:     Extraocular Movements: Extraocular movements intact.     Conjunctiva/sclera: Conjunctivae normal.     Pupils: Pupils are equal, round, and reactive to light.  Cardiovascular:     Rate and Rhythm: Normal rate.     Heart sounds: Normal heart sounds.  Pulmonary:     Effort: Pulmonary effort is normal.     Breath sounds: Normal breath sounds.  Skin:    General: Skin is warm.  Neurological:     Mental  Status: He is alert and oriented to person, place, and time.     Cranial Nerves: No cranial nerve deficit.     Coordination:  Coordination normal.     Gait: Gait abnormal (Shuffling unsteady gait).      UC Treatments / Results  Labs (all labs ordered are listed, but only abnormal results are displayed) Labs Reviewed - No data to display  EKG   Radiology No results found.  Procedures Procedures (including critical care time)  Medications Ordered in UC Medications - No data to display  Initial Impression / Assessment and Plan / UC Course  I have reviewed the triage vital signs and the nursing notes.  Pertinent labs & imaging results that were available during my care of the patient were reviewed by me and considered in my medical decision making (see chart for details).     82 year old with complaint of tinnitus and sense of off balance.  Chart was reviewed has been seen by neurology at [facility], diagnosed with dementia, tinnitus, lacunar infarct. Patient states he has not seen his primary care provider in a long time, he is unable to recall the name of his medications, states he does not take his medications because they "do not do anything". I recommended he schedule an appointment with his primary care provider and follow-up with ENT regarding tinnitus.  I encouraged him to use a walker for assisted ambulation Final Clinical Impressions(s) / UC Diagnoses   Final diagnoses:  Tinnitus, unspecified laterality  Gait disturbance     Discharge Instructions      You need to schedule an appointment with your primary care provider for further evaluation and treatment of your sense of off balance.  I would recommend you see an ear nose and throat doctor regarding the ringing in your ears. Use a walker for assisted ambulation.  Local ENT Dr.Brandon Ma Address: [address] [place], [place] [address] Phone: [phone]   ED Prescriptions   None    PDMP not reviewed  this encounter.   Meliton Rattan, [place] 05/21/23 [phone]

## 2023-07-18 DEATH — deceased
